# Patient Record
Sex: Female | Born: 1960 | Race: Black or African American | Hispanic: No | Smoking: Never smoker
Health system: Southern US, Community
[De-identification: ages and names within clinical notes are randomized; demographics above are authoritative.]

## PROBLEM LIST (undated history)

## (undated) DIAGNOSIS — R413 Other amnesia: Secondary | ICD-10-CM

## (undated) DIAGNOSIS — E785 Hyperlipidemia, unspecified: Secondary | ICD-10-CM

## (undated) DIAGNOSIS — I639 Cerebral infarction, unspecified: Secondary | ICD-10-CM

## (undated) DIAGNOSIS — Z86718 Personal history of other venous thrombosis and embolism: Secondary | ICD-10-CM

## (undated) DIAGNOSIS — I10 Essential (primary) hypertension: Secondary | ICD-10-CM

## (undated) DIAGNOSIS — I519 Heart disease, unspecified: Secondary | ICD-10-CM

## (undated) HISTORY — PX: OTHER SURGICAL HISTORY: SHX169

## (undated) HISTORY — DX: Heart disease, unspecified: I51.9

## (undated) HISTORY — DX: Cerebral infarction, unspecified: I63.9

## (undated) HISTORY — DX: Other amnesia: R41.3

## (undated) HISTORY — PX: TOE SURGERY: SHX1073

## (undated) HISTORY — PX: FOOT SURGERY: SHX648

## (undated) HISTORY — DX: Hyperlipidemia, unspecified: E78.5

## (undated) HISTORY — DX: Essential (primary) hypertension: I10

## (undated) HISTORY — DX: Personal history of other venous thrombosis and embolism: Z86.718

---

## 1996-01-04 HISTORY — PX: ABDOMINAL HYSTERECTOMY: SHX81

## 1999-02-11 ENCOUNTER — Inpatient Hospital Stay (HOSPITAL_COMMUNITY): Admission: EM | Admit: 1999-02-11 | Discharge: 1999-02-14 | Payer: Self-pay | Admitting: Cardiology

## 1999-02-11 ENCOUNTER — Encounter: Payer: Self-pay | Admitting: Emergency Medicine

## 1999-02-12 ENCOUNTER — Encounter: Payer: Self-pay | Admitting: Cardiology

## 1999-02-13 ENCOUNTER — Encounter: Payer: Self-pay | Admitting: Cardiology

## 1999-02-24 ENCOUNTER — Encounter: Payer: Self-pay | Admitting: Cardiology

## 1999-02-24 ENCOUNTER — Ambulatory Visit (HOSPITAL_COMMUNITY): Admission: RE | Admit: 1999-02-24 | Discharge: 1999-02-24 | Payer: Self-pay | Admitting: Cardiology

## 1999-04-19 ENCOUNTER — Encounter: Payer: Self-pay | Admitting: Emergency Medicine

## 1999-04-19 ENCOUNTER — Inpatient Hospital Stay (HOSPITAL_COMMUNITY): Admission: EM | Admit: 1999-04-19 | Discharge: 1999-04-20 | Payer: Self-pay | Admitting: Emergency Medicine

## 2001-12-17 ENCOUNTER — Encounter: Payer: Self-pay | Admitting: Emergency Medicine

## 2001-12-17 ENCOUNTER — Emergency Department (HOSPITAL_COMMUNITY): Admission: EM | Admit: 2001-12-17 | Discharge: 2001-12-17 | Payer: Self-pay | Admitting: Emergency Medicine

## 2002-07-02 ENCOUNTER — Encounter: Payer: Self-pay | Admitting: Family Medicine

## 2002-07-02 ENCOUNTER — Encounter: Admission: RE | Admit: 2002-07-02 | Discharge: 2002-07-02 | Payer: Self-pay | Admitting: Family Medicine

## 2002-09-27 ENCOUNTER — Emergency Department (HOSPITAL_COMMUNITY): Admission: EM | Admit: 2002-09-27 | Discharge: 2002-09-28 | Payer: Self-pay | Admitting: Emergency Medicine

## 2002-09-27 ENCOUNTER — Encounter (HOSPITAL_BASED_OUTPATIENT_CLINIC_OR_DEPARTMENT_OTHER): Payer: Self-pay | Admitting: General Surgery

## 2002-10-02 ENCOUNTER — Other Ambulatory Visit: Admission: RE | Admit: 2002-10-02 | Discharge: 2002-10-02 | Payer: Self-pay | Admitting: *Deleted

## 2002-10-09 ENCOUNTER — Encounter: Payer: Self-pay | Admitting: *Deleted

## 2002-10-11 ENCOUNTER — Ambulatory Visit (HOSPITAL_COMMUNITY): Admission: RE | Admit: 2002-10-11 | Discharge: 2002-10-11 | Payer: Self-pay | Admitting: *Deleted

## 2002-10-23 ENCOUNTER — Ambulatory Visit (HOSPITAL_COMMUNITY): Admission: RE | Admit: 2002-10-23 | Discharge: 2002-10-24 | Payer: Self-pay | Admitting: Cardiology

## 2003-05-08 ENCOUNTER — Encounter (INDEPENDENT_AMBULATORY_CARE_PROVIDER_SITE_OTHER): Payer: Self-pay | Admitting: *Deleted

## 2003-05-08 ENCOUNTER — Inpatient Hospital Stay (HOSPITAL_COMMUNITY): Admission: RE | Admit: 2003-05-08 | Discharge: 2003-05-10 | Payer: Self-pay | Admitting: *Deleted

## 2004-01-08 ENCOUNTER — Encounter: Payer: Self-pay | Admitting: Emergency Medicine

## 2004-01-09 ENCOUNTER — Inpatient Hospital Stay (HOSPITAL_COMMUNITY): Admission: EM | Admit: 2004-01-09 | Discharge: 2004-01-19 | Payer: Self-pay | Admitting: Neurology

## 2004-01-09 ENCOUNTER — Encounter: Payer: Self-pay | Admitting: Internal Medicine

## 2004-01-09 ENCOUNTER — Ambulatory Visit: Payer: Self-pay | Admitting: Internal Medicine

## 2004-01-19 ENCOUNTER — Inpatient Hospital Stay (HOSPITAL_COMMUNITY)
Admission: RE | Admit: 2004-01-19 | Discharge: 2004-01-29 | Payer: Self-pay | Admitting: Physical Medicine & Rehabilitation

## 2004-01-19 ENCOUNTER — Ambulatory Visit: Payer: Self-pay | Admitting: Physical Medicine & Rehabilitation

## 2004-02-04 ENCOUNTER — Encounter
Admission: RE | Admit: 2004-02-04 | Discharge: 2004-05-04 | Payer: Self-pay | Admitting: Physical Medicine & Rehabilitation

## 2004-02-09 ENCOUNTER — Inpatient Hospital Stay (HOSPITAL_COMMUNITY): Admission: EM | Admit: 2004-02-09 | Discharge: 2004-02-10 | Payer: Self-pay | Admitting: Emergency Medicine

## 2004-03-03 ENCOUNTER — Encounter
Admission: RE | Admit: 2004-03-03 | Discharge: 2004-06-01 | Payer: Self-pay | Admitting: Physical Medicine & Rehabilitation

## 2004-03-05 ENCOUNTER — Ambulatory Visit: Payer: Self-pay | Admitting: Physical Medicine & Rehabilitation

## 2004-05-03 ENCOUNTER — Ambulatory Visit: Payer: Self-pay | Admitting: Physical Medicine & Rehabilitation

## 2004-12-17 ENCOUNTER — Emergency Department (HOSPITAL_COMMUNITY): Admission: EM | Admit: 2004-12-17 | Discharge: 2004-12-17 | Payer: Self-pay | Admitting: Emergency Medicine

## 2005-06-07 ENCOUNTER — Ambulatory Visit: Payer: Self-pay | Admitting: Nurse Practitioner

## 2005-06-07 ENCOUNTER — Emergency Department (HOSPITAL_COMMUNITY): Admission: EM | Admit: 2005-06-07 | Discharge: 2005-06-07 | Payer: Self-pay | Admitting: Emergency Medicine

## 2005-06-07 ENCOUNTER — Ambulatory Visit: Payer: Self-pay | Admitting: *Deleted

## 2005-06-21 ENCOUNTER — Ambulatory Visit: Payer: Self-pay | Admitting: Nurse Practitioner

## 2006-03-09 ENCOUNTER — Ambulatory Visit: Payer: Self-pay | Admitting: Nurse Practitioner

## 2006-03-21 ENCOUNTER — Encounter: Admission: RE | Admit: 2006-03-21 | Discharge: 2006-03-21 | Payer: Self-pay | Admitting: Nurse Practitioner

## 2006-04-11 ENCOUNTER — Encounter: Admission: RE | Admit: 2006-04-11 | Discharge: 2006-07-10 | Payer: Self-pay | Admitting: Nurse Practitioner

## 2006-05-13 ENCOUNTER — Ambulatory Visit: Payer: Self-pay | Admitting: Cardiology

## 2006-05-13 ENCOUNTER — Inpatient Hospital Stay (HOSPITAL_COMMUNITY): Admission: EM | Admit: 2006-05-13 | Discharge: 2006-05-22 | Payer: Self-pay | Admitting: Emergency Medicine

## 2006-05-13 ENCOUNTER — Ambulatory Visit: Payer: Self-pay | Admitting: Cardiovascular Disease

## 2006-05-15 ENCOUNTER — Encounter (INDEPENDENT_AMBULATORY_CARE_PROVIDER_SITE_OTHER): Payer: Self-pay | Admitting: Neurology

## 2006-05-15 ENCOUNTER — Encounter (INDEPENDENT_AMBULATORY_CARE_PROVIDER_SITE_OTHER): Payer: Self-pay | Admitting: Interventional Cardiology

## 2006-05-17 ENCOUNTER — Ambulatory Visit: Payer: Self-pay | Admitting: Physical Medicine & Rehabilitation

## 2006-05-25 ENCOUNTER — Ambulatory Visit: Payer: Self-pay | Admitting: Cardiology

## 2006-08-30 ENCOUNTER — Ambulatory Visit: Payer: Self-pay | Admitting: Internal Medicine

## 2006-08-30 ENCOUNTER — Encounter (INDEPENDENT_AMBULATORY_CARE_PROVIDER_SITE_OTHER): Payer: Self-pay | Admitting: Nurse Practitioner

## 2006-08-30 LAB — CONVERTED CEMR LAB
ALT: 13 units/L (ref 0–35)
AST: 13 units/L (ref 0–37)
Albumin: 3.9 g/dL (ref 3.5–5.2)
Basophils Absolute: 0 10*3/uL (ref 0.0–0.1)
Basophils Relative: 1 % (ref 0–1)
CO2: 21 meq/L (ref 19–32)
Calcium: 9.2 mg/dL (ref 8.4–10.5)
Chloride: 107 meq/L (ref 96–112)
Cholesterol: 299 mg/dL — ABNORMAL HIGH (ref 0–200)
Creatinine, Ser: 0.89 mg/dL (ref 0.40–1.20)
Hemoglobin: 14.1 g/dL (ref 12.0–15.0)
Lymphocytes Relative: 36 % (ref 12–46)
MCHC: 32.4 g/dL (ref 30.0–36.0)
Neutro Abs: 3.4 10*3/uL (ref 1.7–7.7)
Neutrophils Relative %: 54 % (ref 43–77)
Platelets: 342 10*3/uL (ref 150–400)
Potassium: 3.8 meq/L (ref 3.5–5.3)
RDW: 14.6 % — ABNORMAL HIGH (ref 11.5–14.0)
Sodium: 140 meq/L (ref 135–145)
Total CHOL/HDL Ratio: 9.1
Total Protein: 7.1 g/dL (ref 6.0–8.3)

## 2006-09-20 ENCOUNTER — Encounter (INDEPENDENT_AMBULATORY_CARE_PROVIDER_SITE_OTHER): Payer: Self-pay | Admitting: *Deleted

## 2008-04-18 ENCOUNTER — Emergency Department (HOSPITAL_COMMUNITY): Admission: EM | Admit: 2008-04-18 | Discharge: 2008-04-18 | Payer: Self-pay | Admitting: Emergency Medicine

## 2008-07-29 ENCOUNTER — Emergency Department (HOSPITAL_COMMUNITY): Admission: EM | Admit: 2008-07-29 | Discharge: 2008-07-29 | Payer: Self-pay | Admitting: Emergency Medicine

## 2009-06-10 ENCOUNTER — Emergency Department (HOSPITAL_COMMUNITY): Admission: EM | Admit: 2009-06-10 | Discharge: 2009-06-10 | Payer: Self-pay | Admitting: Emergency Medicine

## 2010-03-22 LAB — POCT I-STAT, CHEM 8
Creatinine, Ser: 1 mg/dL (ref 0.4–1.2)
Glucose, Bld: 105 mg/dL — ABNORMAL HIGH (ref 70–99)
Hemoglobin: 15.6 g/dL — ABNORMAL HIGH (ref 12.0–15.0)
Potassium: 3.7 mEq/L (ref 3.5–5.1)

## 2010-03-22 LAB — POCT CARDIAC MARKERS
CKMB, poc: 1 ng/mL (ref 1.0–8.0)
Troponin i, poc: <0.05 ng/mL (ref 0.00–0.09)

## 2010-03-22 LAB — CBC
HCT: 44.2 % (ref 36.0–46.0)
Hemoglobin: 14.9 g/dL (ref 12.0–15.0)
MCHC: 33.7 g/dL (ref 30.0–36.0)
RBC: 5.06 MIL/uL (ref 3.87–5.11)

## 2010-03-22 LAB — DIFFERENTIAL
Basophils Relative: 1 % (ref 0–1)
Eosinophils Relative: 1 % (ref 0–5)
Lymphocytes Relative: 25 % (ref 12–46)
Monocytes Absolute: 0.5 10*3/uL (ref 0.1–1.0)
Monocytes Relative: 7 % (ref 3–12)
Neutro Abs: 5 10*3/uL (ref 1.7–7.7)

## 2010-03-22 LAB — BASIC METABOLIC PANEL
CO2: 29 mEq/L (ref 19–32)
Calcium: 9.3 mg/dL (ref 8.4–10.5)
GFR calc Af Amer: >60 mL/min (ref >60)
Potassium: 3.6 mEq/L (ref 3.5–5.1)
Sodium: 140 mEq/L (ref 135–145)

## 2010-04-14 LAB — CBC
Hemoglobin: 15.4 g/dL — ABNORMAL HIGH (ref 12.0–15.0)
MCHC: 33.3 g/dL (ref 30.0–36.0)
RBC: 5.31 MIL/uL — ABNORMAL HIGH (ref 3.87–5.11)
WBC: 6.7 10*3/uL (ref 4.0–10.5)

## 2010-04-14 LAB — COMPREHENSIVE METABOLIC PANEL
BUN: 7 mg/dL (ref 6–23)
CO2: 26 mEq/L (ref 19–32)
Calcium: 9.2 mg/dL (ref 8.4–10.5)
Chloride: 107 mEq/L (ref 96–112)
Creatinine, Ser: 1.06 mg/dL (ref 0.4–1.2)
Glucose, Bld: 112 mg/dL — ABNORMAL HIGH (ref 70–99)
Total Bilirubin: 0.9 mg/dL (ref 0.3–1.2)

## 2010-04-14 LAB — PROTIME-INR
INR: 1 (ref 0.00–1.49)
Prothrombin Time: 13 seconds (ref 11.6–15.2)

## 2010-04-14 LAB — APTT: aPTT: 25 seconds (ref 24–37)

## 2010-04-14 LAB — DIFFERENTIAL
Basophils Absolute: 0 10*3/uL (ref 0.0–0.1)
Eosinophils Relative: 1 % (ref 0–5)
Lymphocytes Relative: 24 % (ref 12–46)
Neutro Abs: 4.5 10*3/uL (ref 1.7–7.7)
Neutrophils Relative %: 67 % (ref 43–77)

## 2010-04-14 LAB — TROPONIN I: Troponin I: <0.01 ng/mL (ref 0.00–0.06)

## 2010-04-14 LAB — CK TOTAL AND CKMB (NOT AT ARMC): Relative Index: INVALID (ref 0.0–2.5)

## 2010-05-18 NOTE — Discharge Summary (Signed)
[name], [name]              ACCOUNT NO.:  [id number]   MEDICAL RECORD NO.:  [id number]          PATIENT TYPE:  INP   LOCATION:  3005                         FACILITY:  [facility]   PHYSICIAN:  Pramod P. Pearlean Brownie, MD    DATE OF BIRTH:  1960-02-22   DATE OF ADMISSION:  05/13/2006  DATE OF DISCHARGE:  05/22/2006                               DISCHARGE SUMMARY   DISCHARGE DIAGNOSES:  1. 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  2. Hypertension.  3. Acute ischemic cardiomyopathy.  4. Coronary artery disease.  5. Hypertension.  6. Hypercholesterolemia.  7. Anemia.  8. Obesity.   DISCHARGE MEDICATIONS:  1. Aspirin 81 mg a day.  2. Coumadin 5 mg a day.  3. Zocor 60 mg a day.  4. Diovan 80 mg a day.  5. Toprol-XL 100 mg a day.   STUDIES PERFORMED:  1. CT of the brain on admission shows no acute abnormality.  Evolution      of chronic ischemic changes in the right frontal lobe with      occasional punctate hyperdensities favored to reflect dystrophic      calcification.  Stable chronic encephalomalacia in the bilateral      parietal and left cerebellar regions.  2. MRI of the brain shows no acute abnormality.  Multifocal      encephalomalacia related to chronic infarct.  3. Cardiac MRI shows ischemic cardiomyopathy, marked to large anterior      apical/inferior apical wall infarction with two-thirds to full-      thickness scar.  Large apical thrombus measuring 1.8 x 1.7 cm in      the area of __________  kinesis.  4. Transthoracic echocardiogram shows EF of 35-40% with severe      hypokinesis of the apical wall.  There was moderate hypokinesis in      the mid-anterior wall.  Left ventricular wall thickness was mildly      increased.  There was a mass along the wall of the left ventricle.      There was a large 21-mm by 23-mm rounded mass along the apex of the      left ventricle.  There appeared to be a stalk connecting it to the      left  ventricle.  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  Aortic root was upper      limits of normal, left atrial size was upper limits of normal.  5. EKG shows normal sinus rhythm with left axis deviation, septal      infarct, age undetermined, T-wave abnormality, consider      anterolateral ischemia, abnormal ECG.  6. 2-D echocardiogram shows no evidence of ICA stenosis.  7. EEG shows left posterior temporal slowing.  No seizure activity.  8. Transcranial Doppler performed, results pending.   LABORATORY STUDIES:  CBC with RDW 14.4, otherwise normal differential,  with absolute lymphocytes 3.7 and absolute monocytes 0.9; otherwise,  normal coagulation studies normal on admission.  Day  of discharge, INR  2.4.  Routine chemistries with a  BUN of 5, creatinine 0.94; otherwise,  normal.  Liver function tests with albumin 3.3; otherwise, normal.  Hemoglobin A1C 5.3.  Homocystine 4.3.  Cardiac enzymes with CK 264;  otherwise, normal.  Cholesterol 252, triglycerides 145, HDL 26 and LDL  197.  Alcohol level was less than 5.  The urine drug screen was  negative.  Urinalysis showed small bilirubin, 30 protein, and few  epithelials, with 3-6 red blood cells.   HISTORY OF PRESENT ILLNESS:  Ms. Capwell is a 50 year old right-handed  African American female who developed sudden onset of confusion and  difficulty speaking.  EMS was called and brought her to the emergency  room.  In the emergency room, the patient was is without any family are  friends and does appear confused and anxious.  The patient had been  admitted in May of 2006 with a right MCA infarct which is still seen on  CT.  It was a rather large stroke involving the posterior frontal  temporal and part of the parietal lobe.  This infarct was secondary to a  cardiac mass for which she was placed on Coumadin.  Her INR in the  emergency room is normal.  Therefore, it is felt the  patient was not on  Coumadin prior to admission.  The patient has vascular risk factors of  hypertension, coronary artery disease, and ischemic cardiomyopathy.  Her  blood pressure in the emergency room was 220/115.  She was not a TPA  candidate secondary to unknown onset.  She will be admitted to the  hospital for further stroke evaluation.  She was not a TPA candidate  secondary to time.   HOSPITAL COURSE:  A 2-D echocardiogram confirmed a cardiac mass which  was felt to either be a thrombus as seen in 2006 or a tumor.  [facility] was called to consult.  MRI of the heart was ordered to  determine clot versus tumor, and MRI proved it was clot.  The patient  was placed on IV heparin for secondary stroke prevention and started on  Coumadin.  Once she was therapeutic, she was ready for discharge home.  Of note, the patient was found to have elevated lipids and was placed on  Zocor.  She also had ischemic cardiomyopathy which the cardiologists are  treating with medicines currently.  Neurologically, the patient with  significant expressive and receptive aphasia throughout the hospital  stay.  She was evaluated by PT and OT and felt to be too high-level for  rehab.  The patient lives with her daughter who is pregnant and  expecting any day.  Plans are for her to return home with her daughter,  and we will add home health, PT, OT, and speech therapy.  We have  recommended that the patient have 24-hour care at time of discharge.  The family are unsure if they can provide.  Again, the patient is on  aspirin and Coumadin for secondary stroke prevention and will need  followup with the [facility].   When the patient was seen in 2006, her primary care physician was Dr.  Modesto Charon.  Since that time she has changed primary care physicians to  [facility].  Since she has been a patient at [facility], she has not  been taking Coumadin and is unsure when she stopped taking Coumadin after  the first stroke, how long it lasted.  Arrangements will be made  for [facility] or  [facility] to follow up for Coumadin level.  Continue on  hold through this dictation trying to make her a followup appointment  with [facility].  The patient does plan to live with Artelia Laroche, her  daughter.  Her mother is involved in her care.   CONDITION ON DISCHARGE:  The patient alert and oriented to person and  place.  Her speech is clear.  She has mild expressive aphasia.  She can  name but she does perseverate.  She can repeat but she has decreased  spontaneous speech and word finding difficulty.  She can follow simple  commands.  She can show two fingers.  She has difficulty with complex  commands.  She has left hematemesis that is old and no drift.  She has  decreased grip on the left which is new.  Her gait is slightly unsteady  and wide-based.  Her chest is clear to auscultation.  Heart rate is  regular.   DISCHARGE/PLAN:  1. Discharge home with family.  24-hour care recommended.  2. Coumadin for secondary stroke prevention.  3. Will need followup Coumadin level check on Wednesday.  Still trying      to make appointment with [facility] at this time.  If unable to      make appointment with [facility], will arrange with [facility].  4. Follow up with Dr. Jens Som at [facility] in 4 weeks.  5. Follow up with [facility] in 4 weeks.  6. Follow up with Dr. Marlowe Shores in 2 to 3 months.      Annie Main, N.P.    ______________________________  Sunny Schlein. Pearlean Brownie, MD    SB/MEDQ  D:  05/22/2006  T:  05/22/2006  Job:  [id number]   cc:   Pramod P. Pearlean Brownie, MD  Madolyn Frieze. Jens Som, MD, [facility]  [facility]  Tracie Harrier, M.D.

## 2010-05-18 NOTE — Procedures (Signed)
EEG NUMBER:  Is [id number].   This is a 50 year old patient with a history of confusion and left-sided  weakness.  The patient is being evaluated for the confusion.  This is a  routine EEG.   No skull defects were noted.   MEDICATIONS:  Zocor, Protonix, Normodyne, Reglan, Tylenol.   EEG CLASSIFICATION:  Dysrhythmia grade 2 left posterior temporal.   DESCRIPTION OF THE RECORDING:  Background rhythm of this recording  consists of a fairly well modulated, medium amplitude alpha rhythm of 8  Hz that is reactive to eye-opening and closure.  As the record  progresses, the most notable feature of the recording is some  dysrhythmic theta activity emanating from the left posterior temporal  area.  This is occasionally associated with some 2 Hz delta activity as  well.  As the record progresses, this posterior temporal slowing on the  left seems to be fairly persistent.  Photic stimulation is performed  resulting in a bilateral photic drive response.  Hyperventilation is  also performed resulting in minimal buildup of the background rhythm  activities will more prominent slowing seen in the left posterior  temporal areas.  At no time during recording, does there appear to be  evidence of spike, spike wave discharges, or evidence of focal slowing,  other than what is mentioned above.   EKG monitor shows no evidence of cardiac rhythm abnormalities with a  heart rate of 78.   IMPRESSION:  This is an abnormal EEG recording due to slowing emanating  from the left posterior temporal area.  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  No electrographic seizures were  seen during the recording.      Marlan Palau, M.D.  Electronically Signed     NWG:NFAO  D:  05/15/2006 13:39:20  T:  05/15/2006 14:23:48  Job #:  [id number]

## 2010-05-18 NOTE — Consult Note (Signed)
[name], [name] NO.:  [id number]   MEDICAL RECORD NO.:  [id number]          PATIENT TYPE:  EMS   LOCATION:  MAJO                         FACILITY:  [facility]   PHYSICIAN:  Melvyn Novas, M.D.  DATE OF BIRTH:  1960/06/01   DATE OF CONSULTATION:  05/13/2006  DATE OF DISCHARGE:                                 CONSULTATION   ADDENDUM:  Ms. Oyama's laboratory results have returned.  Sodium is  139, potassium is 3.6, chloride 106, CO2 of 26, BUN is 10, glucose is  119, hematocrit of 48, hemoglobin was 16.3.  The patient's PT and INR at  this time are still pending, and we are awaiting a tox screen.   The patient denies using tobacco, alcohol or illegal drug products.      Melvyn Novas, M.D.  Electronically Signed     CD/MEDQ  D:  05/13/2006  T:  05/13/2006  Job:  [id number]   cc:   Thelma Barge P. Modesto Charon, M.D.  Pramod P. Pearlean Brownie, MD

## 2010-05-18 NOTE — Consult Note (Signed)
[name], [name]              ACCOUNT NO.:  [id number]   MEDICAL RECORD NO.:  [id number]          PATIENT TYPE:  INP   LOCATION:  3005                         FACILITY:  [facility]   PHYSICIAN:  Madolyn Frieze. Jens Som, MD, FACCDATE OF BIRTH:  11/25/1960   DATE OF CONSULTATION:  05/16/2006  DATE OF DISCHARGE:                                 CONSULTATION   PRIMARY CARDIOLOGIST:  Learta Codding, MD,FACC.  (in [place], [place]).   PRIMARY CARE PHYSICIAN:  [facility]   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   HISTORY OF PRESENT ILLNESS:  [name] is a 50 year old female with  a history of coronary artery disease.  She was last evaluated by  cardiology in 2006 when she was admitted to the hospital for a CVA.  At  that time she had an echocardiogram demonstrating and apical mass and  she was anticoagulated with Coumadin.  Since then cardiology has not  seen her.  In subsequent notes and dictations she was noted to have been  on Coumadin later in 2006, but a follow-up visit and ER visit from 2007,  do not list Coumadin as one of her medications and she was not taking it  when she came to the hospital this time.   [name] was admitted on May 13, 2006, for possible CVA and she was  also felt to have hypertensive encephalopathy.  Her initial imaging  studies which included a CT and MRI were negative.  However, repeat CT  on May 15, 2006, did show a subacute infarct.  A repeat echocardiogram  again showed an apical mass with an apical wall motion abnormality.  Cardiology was asked to evaluate her.   [name] is able to answer yes or no to questions but cannot provide  any details.  She denies chest pain or shortness of breath.  We are not  able to determine when she was taken off Coumadin and for what reason.   PAST MEDICAL HISTORY:  1. Status post anterior MI in 2001 with a Royal Elite stent in the      LAD.  2. Status post cardiac catheterization in  2004, showing 70-80% in-      stent restenosis in the proximal LAD which was treated with PTCA      and Cypher stent reducing the stenosis to 0.  Circumflex and RCA      systems had no significant disease.  An EF at that time was 45%.  3. Status post participation in the STEEPLE trial in 2004 which      involved IV Lovenox.  4. Morbid obesity.  5. Hypertension.  6. Hyperlipidemia.  7. History of urine fibroids causing blood loss anemia.  8. Right MCA CVA in January 2006.  9. Splenic infarcts seen on abdominal CT in 2006 at the time of her      CVA.  10.Ischemic cardiomyopathy with an EF of 35-40% by echocardiogram this      admission.  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  12.History of thrombocytopenia with a platelet count of 112,000 in      2006 while she was on heparin, HIP panel negative.   PAST SURGICAL HISTORY:  1. She is status post cardiac catheterization x2.  2. Total abdominal hysterectomy.  3. Foot surgery.  4. Right rotator cuff surgery.   ALLERGIES:  NO KNOWN DRUG ALLERGIES.   CURRENT MEDICATIONS:  1. Aspirin 325 mg daily.  2. Lovenox 40 mg subcu daily.  3. Toprol XL 25 mg daily.  4. Protonix 40 mg daily.  5. Zocor 60 mg daily.   SOCIAL HISTORY:  She lives in [place], [place], with family  members.  She is not employed.  She is disabled secondary to her  previous CVA, but not known to be on disability.  She has no history of  alcohol, tobacco or drug abuse.   FAMILY HISTORY:  Both her parents are deceased and had a history of  hypertension, but no other family history is available.   REVIEW OF SYSTEMS:  [name] is dysphasic but can answer yes or no  questions.  She denies all symptoms but there is concern that she does  not accurately understand the questioning.  She does, however, deny  chest pain, shortness of breath, bloody stools or black tarry stools,  abdominal  pain or reflux symptoms, arthralgias and depression.  A full  review of systems is otherwise negative per patient report.   PHYSICAL EXAMINATION:  VITAL SIGNS:  Temperature is 98.7, blood pressure  133/85, pulse 81, respiratory 20, O2 saturation 98% on room air.  GENERAL:  She is an alert and oriented, obese [place] female  who is pleasant but has expressive aphasia that seems to frustrate her.  HEENT:  Normal.  NECK:  There is no lymphadenopathy, thyromegaly, bruit or JVD noted.  CARDIOVASCULAR:  Her heart is regular in rate and rhythm with no  significant murmur, rub or gallop noted.  Distal pulses are intact in  all for extremities with no femoral bruits appreciated.  LUNGS:  Clear to auscultation bilaterally.  SKIN:  No rashes or lesions are noted.  ABDOMEN:  Soft and nontender with active bowel sounds and no rebound or  guarding is noted.  EXTREMITIES:  There is no cyanosis, clubbing or edema noted.  MUSCULOSKELETAL:  There is no joint deformity or effusions and no spine  or CVA tenderness.  NEUROLOGIC:  She is alert and responds to her name.  She can answer yes  or no to questions.  She has no obvious asymmetry.   IMPRESSION:  No obvious facial asymmetry noted and minimal weakness is  detected on the left side.   EKG is sinus rhythm, rate 86, with a septal infarct noted.   A 2-D echocardiogram, EF is 35-40% with a large 21 x 23 mm rounded mass  along the apex of the left ventricle.  There was severe hypokinesis of  the apical wall and moderate hypokinesis of the mid anterior wall.  There appeared to be a stalk connecting the mass to the left ventricle.  It is similar in size to the echocardiogram from 2006.   Head CT dated May 15, 2006, chronic right MCA infarct is noted.  She has  developed an ill-defined low-density in the left parietal lobe  compatible with a subacute cortical infarct since CT dated May 13, 2006. Mild local mass effect is seen but no midline  shift.  There is no acute  hemorrhage.  LABORATORY VALUES:  Hemoglobin 16.3, hematocrit 48, wbc 10.1, platelets  312.  INR 1 on admission.  Sodium 138, potassium 3.4, chloride 106, CO2  23, BUN 5, creatinine 0.79, glucose 97.  CK-MB 264/1.4 with a troponin I  of 0.03.  Hemoglobin A1c 5.3. Total cholesterol 252, triglycerides 145,  HDL 26, LDL 197.  Homocysteine level 4.3.  Urine drug screen was  negative and EtOH less than 5.   IMPRESSION:  Left ventricular mass:  [name] has a history of a  probable apical thrombus as well as hypertension, hyperlipidemia and a  cerebrovascular accident.  We are seeing her for evaluation of an  intraventricular mass.  She had a cerebrovascular accident in January  2006 and at that time an echo showed apical akinesis, an ejection  fraction of 30-35% and a large apical mass felt most likely to be apical  thrombus.  She also had a splenic infarct at that time.  She was treated  with Coumadin but the duration is unclear.  It is also unclear what  medication she was taking prior to admission.  She was admitted on May 13, 2006 with hypertension and weakness and an echo now shows a  continued apical mass, thrombus versus tumor.  The etiology of the mass  is unclear.  However, given previous apical infarct and akinesis, it is  felt that this is most likely thrombosis especially since it is not  clear that patient was taking Coumadin for an extended period of time  and her INR on admission was within normal limits.  A cardiac MRI may  differentiate between tumor and thrombus.  She will be evaluated for  surgical resection versus Coumadin if thrombus is the etiology.  We will  add heparin (okay with neurology).  She is being treated appropriately  with aspirin and beta blockers and Statin.  We will add an ACE inhibitor  secondary to left ventricular dysfunction and hypertension and her beta  blocker and ACE inhibitor can be up-titrated as tolerated.  Her  renal  function will be followed closely.  Of note, her potassium will be  supplemented.  Because of anticoagulation with heparin her platelets  will be followed closely although a HIP panel was negative last time and  her platelets never dropped below 112,000.  In 2006 when she was  therapeutic on Coumadin, a hypercoagulable profile showed low  circulating as well as functional protein C and protein S levels with a  negative lupus anticoagulant and negative Factor 5 Leiden gene mutation.  The significance of these results is unclear, but further evaluation may  be indicated.      Theodore Demark, PA-C      Madolyn Frieze. Jens Som, MD, [facility]  Electronically Signed    RB/MEDQ  D:  05/16/2006  T:  05/16/2006  Job:  [id number]

## 2010-05-18 NOTE — H&P (Signed)
[name], [name]              ACCOUNT NO.:  [id number]   MEDICAL RECORD NO.:  [id number]          PATIENT TYPE:  INP   LOCATION:  3005                         FACILITY:  [facility]   PHYSICIAN:  Melvyn Novas, M.D.  DATE OF BIRTH:  1960-06-17   DATE OF ADMISSION:  05/13/2006  DATE OF DISCHARGE:                              HISTORY & PHYSICAL   NEUROLOGY CODE STROKE ADMISSION:   HISTORY OF PRESENT ILLNESS:  [name] is a 50 year old African-  American right-handed female, and according to the code stroke  information had developed confusion and difficulty speaking when EMS  from [facility] brought her to get to the emergency room.  Here in  the emergency room, the patient is without any family or friend support  and appears indeed confused and anxious.  The patient had been admitted  in May of 2006 with a right MCA infarction which is still visible on her  ER CT scan from today.  This is a rather large stroke involving the  posterior frontal, temporal and part of the parietal lobe.  The patient, according to the medical charts available by E-chart, was  left with a spastic left hemiparesis and had undergone rehabilitation  here at the [facility] after being discharged from Dr. Marlis Edelson  stroke service.   She also carries a diagnosis of hypertension, coronary artery disease,  left biceps tenonitis which followed her hemispasticity.  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  The patient was discharged on therapeutic Coumadin in 2006, and her  review of systems at the time only induced some vertigo, hemispasticity,  dyslipidemia and a history of splenic infarction, unknown date.   The patient at the time of evaluation here in the ER through the stroke  team shows left-sided grip strength weakness.  She  appears oriented to  name and place, but repeatedly perseverates and asks Am I going to  die?  Asked if she had recently followed up with medical care, she  states that her doctor, Dr. Modesto Charon, had left the practice she used to be  seen in and did not answer my question when she last refilled her  antihypertensive.  On a CT scan that was obtained today, there is no new stroke seen.  Her  hypertension reached 220/115 at the time when she was brought into the  emergency room, and the patient was supposed to take Coumadin for her  apical thrombus, as we understand from her E-chart.   SOCIAL HISTORY:  According to the E-chart, the patient used to live with  family here in the [place] area and had returned to live with her  family after her initial stroke.   REVIEW OF SYSTEMS:  The patient reports today, I don't know what's  going on me.  I am confused.  Tell me I'm not going to die, tell me I'm  not going to die.  She does not report any pain, blurred vision,  anxiety, depression, weakness that she has noted to be increased,  shortness of breath.  There is no diaphoresis, no coughing.  The patient  only say that she has to go to the bathroom.  She does not comprehend  that she has a Foley catheter.   PHYSICAL EXAMINATION:  VITAL SIGNS:  Blood pressure is 180/110 now after  the first dose of labetalol was given, pulse is 88, respiratory rate 18.  The patient is saturating 98% on room air.  Nasal cannula at 2 liters  was ordered.  GENERAL:  The patient could sit up with minimal assistance, shows a left-  sided pronator drift, shows a left-sided facial droop.  Her affect is  impaired by her anxiety.  She is apparently well-groomed, well-  nourished.  LUNGS:  clear to auscultation.  She is no peripheral clubbing or edema.  ABDOMEN:  Obese but not distended.  There are normal bowel sounds.  There is no flank pain.   NEUROLOGIC:  The patient was able to name her birth date and knows where   she is, could give [place] her name and her address.  She does not know if  family members have followed EMS.  Cranial nerve examination - pupils  react equal to light.  The patient has a left lower facial weakness that  is more evidence when she provides a grin.  She can also close her left  eye, not quite as tight as the right.  She was able to follow a moving  object in all planes with gaze and peripheral visual field examination  was remarkably normal.  The patient had a uvula and tongue in midline.  Bedside swallow evaluation with ice water and a cracker was passed.  Motor examination - the patient is able to extend the left arm, not  fully.  She also has wrist flexion/extension weakness.  She does have a  slight increase in reflexes throughout the left side.  She was able to  provide antigravity strength for 10 seconds with both upper extremities.  There is no tremor, no ataxia.  She had dysmetria on finger-nose test,  but repeated testing did not show the dysmetria to be persistent.  She  could provide a heel-to-shin test with her lower extremities, could  raise either leg for over 5 seconds, left side shows a drift.  Dorsiflexion and plantar flexion were intact.  Left Babinski response  was equivocal, right side clearly downgoing toes.  Sensory to pinprick  and temperature and vibration - the patient states that she feels that  this has not changed.  She has always had a slight left-sided decrease  in sensation.  Her cognition is not quite appropriate.  The patient again appears very anxious.  She has, however, clear speech  but seems to have word-finding difficulties plus the perseveration, as  mentioned above.   ASSESSMENT:  At this time, we will treat her for possible hypertensive  encephalopathy.  I am not sure that she has suffered a new infarction or  if all the findings above are explained with the previous right middle cerebral artery infarction and the hypertension that she  has suffered  now.  I will order a diffusion weighted image MRI.  This limited study but give [place] hopefully the answer if she indeed  suffered a new stroke, and if so, her blood pressure control needs to be  achieved first.  I have ordered labetalol 20 mg.  PT/INR was pending at the time, and  we  will need to know if the patient is still on Coumadin.  The patient can  be admitted to a primary care service if an acute stroke is not found.  She will be maintained on beta blockers and hydrochlorothiazide for  hypertension during hospitalization.  I will also order Protonix and  Zocor, given her past history of gastroesophageal reflux disease and  hyperlipidemia.  I would like to add that the patient was not able to tell me which  medications she is on, how often she takes, what kind of pills, and was  not able to tell me where she receives current medical outpatient care.      Melvyn Novas, M.D.  Electronically Signed     CD/MEDQ  D:  05/13/2006  T:  05/14/2006  Job:  [id number]   cc:   Pramod P. Pearlean Brownie, MD  Maryla Morrow. Modesto Charon, M.D.

## 2010-05-21 NOTE — Cardiovascular Report (Signed)
[name], Pace                        ACCOUNT NO.:  [id number]   MEDICAL RECORD NO.:  [id number]                   PATIENT TYPE:  OIB   LOCATION:  6525                                 FACILITY:  [facility]   PHYSICIAN:  Charlies Constable, M.D.                  DATE OF BIRTH:  13-Nov-1960   DATE OF PROCEDURE:  10/23/2002  DATE OF DISCHARGE:                              CARDIAC CATHETERIZATION   CLINICAL HISTORY:  Mrs. Ruffini is 50 years old and in 2001 had an anterior  wall infarction treated with stenting of the proximal LAD by Dr. Riley Kill.  She recently has had some symptoms of shortness of breath on exertion, but  has also been anemic.  She was scheduled to have fibroid surgery and was  seen by Dr. Andee Lineman who arranged for her to have a Cardiolite scan which was  abnormal showing an anterior apical scar with superimposed ischemia.  He  scheduled her evaluation with angiography.   PROCEDURE:  The procedure was performed via the right femoral artery using  arterial sheath and 6 French preformed coronary catheters. A front wall  arterial puncture was performed and Omnipaque contrast was used.  After  completion of the diagnostic study, we made the decision to proceed with  intervention on the lesion within the stent in the proximal LAD.   The patient was consented and enrolled in the STEEPLE trial and was  randomized to Lovenox 0.5 mg/kg IV.  She also received 600 mg of Plavix.  We  used a Q4 6 [place] guiding catheter with side holes and a short Floppy wire.  We crossed the lesion in the proximal LAD within the stent with the wire  without difficulty.  We direct stented with a 3.0 x 22-mm Cypher stent  deploying this with one inflation of 15 atmospheres for 30 seconds.  We then  post dilated with a 3.25 x 20-mm Quantum Maverick performing three  inflations up to 17 atmospheres for 30 seconds.  Repeat diagnostics were  then performed through the guiding catheter.  The patient tolerated  the  procedure well and left the laboratory in satisfactory condition.   RESULTS:  Left main coronary artery:  The left main coronary was free of  significant disease.   Left anterior descending artery:  The left anterior descending artery gave  rise to four septal perforators in the diagonal branch.  There was 70-80%  diffuse in-stent restenosis within the stent in the proximal LAD.  The rest  of the vessel was free of significant obstruction.   Circumflex artery:  The circumflex artery gave rise to a marginal branch,  second marginal branch which had three subbranches and a posterior lateral  branch.  These vessels were free of significant disease.   Right coronary artery:  The right coronary artery is a small to moderate  size vessel that gave rise to a conus branch, two right  ventricular  branches, a posterior descending branch and two posterior lateral branches.   LEFT VENTRICULOGRAM:  The left ventriculogram performed in the RAO  projection showed akinesis of the apex.  The anterior lateral wall and  anterobasal wall and inferior wall all moved well.  The estimated ejection  fraction was 45%.   Following stenting of the lesion in the proximal LAD, the stenosis improved  from 70-80% to 10%.  There was no dissection seen.   The aortic pressure was 151/95 with mean of 118.  Left ventricular pressure  was 151/22.    CONCLUSIONS:  1. Coronary artery disease status anterior wall myocardial infarction in     2001 treated with stenting of the left anterior descending with 70-80% in-     stent restenosis, no major obstruction of the circumflex and right     coronary artery and apical wall akinesis.  2. Successful stenting of the in-stent restenotic lesion in the proximal     left anterior descending with improvement in stent renarrowing from 70-     80% to 10%.   DISPOSITION:  The patient to post anesthesia unit for further observation.  We plan to let the patient go home  tomorrow if she is stable.   She will need to have fibroid surgery, but will need to postpone this three  months while she is on Plavix.  I discussed the case with Dr. Andee Lineman and Dr.  Riley Kill and discussed the options before we made a decision to treat her in-  stent restenosis with the drug-eluting stent.  We felt this was best for the  heart although it did have disadvantage of requiring [place] to wait three months  for her hysterectomy.                                                   Charlies Constable, M.D.    BB/MEDQ  D:  10/23/2002  T:  10/23/2002  Job:  [id number]   cc:   Learta Codding, M.D.  [address]  [address]  [place]  [place] [address]   Maryla Morrow. Modesto Charon, M.D.  [address]  [place]  [place] [address]  Fax: [phone]   Tracie Harrier, M.D.  [address] [place]  [place] [address]  Fax: [phone]

## 2010-05-21 NOTE — Discharge Summary (Signed)
   NAMEANAHIS, FURGESON                        ACCOUNT NO.:  [id number]   MEDICAL RECORD NO.:  [id number]                   PATIENT TYPE:  OUT   LOCATION:  MLAB                                 FACILITY:  [facility]   PHYSICIAN:  Tracie Harrier, M.D.              DATE OF BIRTH:  06-05-1960   DATE OF ADMISSION:  10/11/2002  DATE OF DISCHARGE:  10/11/2002                                 DISCHARGE SUMMARY   HOSPITAL COURSE:  Ms. Blea was admitted this morning to undergo abdominal  hysterectomy for large symptomatic uterine fibroids.  She underwent a  Cardiolite test yesterday at Dr. Margarita Mail office.  We called this morning  for results of this and it did show some ischemia and was abnormal.  Her  surgery has been scheduled at a later date pending further workup of cardiac  status.   ASSESSMENT:  1. Cardiovascular disease - status undetermined.  2. Uterine fibroids - stable.   PLAN:  1. Discharge.  2. Further cardiac workup with surgery pending this workup and cardiac     clearance.                                               Tracie Harrier, M.D.    REG/MEDQ  D:  11/08/2002  T:  11/08/2002  Job:  [id number]

## 2010-05-21 NOTE — H&P (Signed)
NAMEALEXA, BLISH NO.:  [id number]   MEDICAL RECORD NO.:  [id number]          PATIENT TYPE:  EMS   LOCATION:  MAJO                         FACILITY:  [facility]   PHYSICIAN:  Melvyn Novas, M.D.  DATE OF BIRTH:  12/23/1960   DATE OF ADMISSION:  02/08/2004  DATE OF DISCHARGE:                                HISTORY & PHYSICAL   PRIMARY CARE PHYSICIAN:  Francis P. Modesto Charon, M.D.   Ms. Gravlin states that she has a past medical history of multiple strokes,  myocardial infarction with stent placement, hypertension,  hypercholesterolemia, and morbid obesity, possible OSA.  The patient has an  ejection fraction of only 30% she states.  She has been started on  medications after suffering a stroke in January of this year in her temporal  and frontal lobe and had in rehab recovered from her left hemiparesis.  Today she states that she woke up from a nap and while turning in bed to the  side had the sudden onset of severe vertigo.  The bed was spinning, she  became nauseated and clammy.  She presented here to the ER yesterday at 2:30  p.m.  A CT was obtained and shows new lesions that were read as subacute in  the occipital lobes bilaterally.  These lesions were not seen on the January  imaging studies.  Ms. Dantes INR was 2.5, her creatinine was 1.3, glucose  86.  She shows no abnormalities on her CBC and on her basic metabolic  profile.   CURRENT MEDICATIONS:  1.  Toprol XL, she does not name the dose.  2.  Lipitor and Coumadin, she does not name the dose.   PHYSICAL EXAMINATION:  VITAL SIGNS:  Heart rate 52 per minute, blood  pressure 136/80, respiratory rate 18, temperature 97.  There was a question  of a left carotid bruit.  No bruises, rash, edema.  LUNGS:  Clear to auscultation.  HEART:  Regular and sinus rhythm.  MENTAL STATUS:  Alert and oriented x3.  Fluent speech.  The patient can pass  the medical information in detail and appears to have no trouble with  comprehension or production of speech.  CRANIAL NERVES:  The patient shows facial asymmetry with a left-sided droop  and an asymmetric palpable fissure bilaterally.  Her left pupil appears  larger than her right and is also less reactive to light.  Also the patient  can provide full extraocular movements and follows gaze in all directions.  She does not show nystagmus, but feels that her left eye is acutely more  blurred.  I checked by finger perimetry, her peripheral vision and it seems  that her peripheral vision on the left is more impaired than on the right.  Gag was easily elicitable and the patient shows tongue and uvula in midline.  STRENGTH:  The patient has upgoing toe on the right which I could not find  on her previous admission notes to be present.  She has a drift when  extending the right leg against gravity on the count of 8.  Her left leg  shows clonus  of two beats in the ankle.  SENSORY:  The patient states she feels heavy, but she has normal primary  modalities throughout the right body half.  Finger-to-nose shows dysmetria  on the left and drift on the right.   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  Since the patient has a  history of apex thrombus by echocardiogram and is therapeutic on her  Coumadin,  I would like to admit her to check a repeat echo if the thrombus is still  present.  An MRI should be repeated.  We will send for hypercoagulability  panel and I have discussed these steps with Dr. Modesto Charon, whose [facility] will  follow her for her internal medicine and primary care needs.      CD/MEDQ  D:  02/09/2004  T:  02/09/2004  Job:  [id number]   cc:   Pramod P. Pearlean Brownie, MD  Fax: [phone]

## 2010-05-21 NOTE — Cardiovascular Report (Signed)
[employer] [facility]  Patient:    URIYAH, MASSIMO                     MRN: [id number] Proc. Date: 02/11/99 Adm. Date:  [id number] Attending:  Learta Codding                        Cardiac Catheterization  INDICATIONS:  Ms. Osterloh is a very pleasant 50 year old African-American female who presents with ongoing chest pain.  She has mild EKG changes with evidence of Q-waves in V1 and V2, and anterolateral ST-T wave abnormalities which are nondiagnostic.  She was seen by Dr. Andee Lineman and brought to the cardiac catheterization laboratory at [facility].  There, she underwent diagnostic catheterization by Dr. Andee Lineman, which revealed evidence of an acute occlusion of the left anterior descending artery beyond the origin of a ramus intermedius and septal perforator.  The distal vessel filled faintly by collaterals from the right circulation and also some very slow antegrade flow.  There was evidence of mild  plaquing proximally.  The right coronary and circumflex were without critical disease.  The left ventriculogram revealed an ejection fraction of 49% and anteroapical akinesis.  With this, the decision was made to perform emergency percutaneous coronary intervention.  She was consented and agreeable to enrollment in the X-TRACT acute MI registry.  DESCRIPTION OF PROCEDURE:  The patient had an indwelling 6-French sheath from her original procedure.  Heparin was given appropriately and the ACT was greater than 200 seconds.  ReoPro was given appropriately.  A JL-3.5 guiding catheter was used to cannulate the left main coronary.  The LAD was then crossed using a 0.014 high-torque floppy wire in which we were able to put the wire in the distal vessel. There did appear to be diffuse distal LAD disease at the apical portion of the vessel.  The totally occluded lesion was then attempted to cross with the X-CISER, but it was difficult to seat the X-CISER  right at the site of the lesion and we  were unable to cross.  We then predilated with a 2 mm CrossSail balloon. Antegrade flow was established.  We then passed the X-CISER for one pass across the area f stenosis and this was pulled back.  There was mild ST elevation with the device in place and, therefore, it was removed.  The lesion was then dilated using a 3 mm  balloon with some improvement.  We then used a 3 mm NIR Royale Elite stent which was placed just past the septal perforator and this was taken up to 16 atm. With this, there was marked improvement in the appearance of the artery.  With nitroglycerin, there was slight underdilatation of the distal portion of the stent and the stent was then post-dilated using a 3.5 mm Solaris balloon up to 18 atm  which yielded a 3.8 artery.  The mid stent was also dilated, but the proximal stent was slightly oversized because of diffuse plaquing of the proximal vessels distal to the septal perforator.  However, the vessel proximal to the stent was at least a 3 mm artery in terms of patency.  TIMI-3 flow was established.  There was evidence of diffuse apical LAD disease.  The final result was excellent.  RESULTS:  ANGIOGRAPHIC DATA: 1. The left main coronary artery was long.  This was free of disease.  2. The left anterior descending artery had segmental plaquing of approximately  30% to 40% from the origin out to the site of total occlusion.  This crossed  the septal perforator.  The grading of this was based upon the distal vessel    after reperfusion.  The area of total occlusion was subtotally occluded after    wire crossing and 2.0 dilatation and then was stented successfully to 0%    residual luminal narrowing after the stent was post-dilated.  The distal vessel    consisted of a large caliber 3.5 mm left anterior descending artery providing a    diagonal branch and an apical LAD.  The apical LAD appeared  diffusely diseased    and not really very amenable to percutaneous intervention because of the small    caliber in the apical portion.  3. Other incidental findings include a mild plaquing of the first marginal branch    just after its bifurcation into a smaller subbranch and a larger subbranch.  The remainder of the circumflex appeared without obvious critical disease.  As    noted, the right coronary artery also appeared to be free of critical disease.  CONCLUSIONS:  Successful percutaneous coronary intervention of a totally occluded left anterior descending artery with X-TRACT treatment and adjunctive stenting f the left anterior descending artery. DD:  02/11/99 TD:  02/11/99 Job: [id number]

## 2010-05-21 NOTE — Assessment & Plan Note (Signed)
MEDICAL RECORD NUMBER:  [id number].   This is a followup visit for [name] after her right MCA infarct. The  patient was on rehab briefly in February. Her main issue has regarded the  left arm. She had rather good function in the left leg. The left arm has  been getting stronger but still is slow in respect to motor return. She is  receiving outpatient therapies currently with PT and OT at [facility].  She denies any frank numbness in the left arm. She has had some left  shoulder pain which has been bothersome but not to the point where she needs  significant pain medication. She has been getting by on Darvocet currently.  She was changed to Darvocet from Ultram because the Ultram caused her to be  a little bit irritable and impaired her sleep. The patient is living at home  with family. The patient has no other specific complaints today.   SOCIAL HISTORY:  See pertinent positives from above.   REVIEW OF SYSTEMS:  The patient reports blurred vision, anxiety, depression,  weakness in the left arm. Denies any chest pain, shortness of breath, cold,  flu, wheezing, or coughing symptoms. Denies diarrhea, constipation, bowel or  bladder continence, fevers, chills or weight changes.   PHYSICAL EXAMINATION:  Blood pressure is 120/92, pulse is 47, respiratory  rate is 16. She is saturating 98% on room air. The patient walks with a  slight limp favoring the left side, but this is minimal. Affect is bright  and appropriate. Appearance is well kept. The patient had continued weakness  in the left upper extremity in the 2 to 2+/5 range. She has occasional  catches in the elbow and wrist today. I rated tone at trace out of 5.  Reflexes are diffusely increased on the left side. Left lower extremity  strength was 5/5 with some slight diminishment in fine motor coordination  but minimal. Sensory exam was intact. She had no focal cranial nerve  findings today. Speech was clear and articulate. No  aphasia was seen. No  focal visual deficiencies were note. Cognition was appropriate. Left  shoulder was painful along the short head of the biceps tendon.  HEART:  Regular rate and rhythm.  ABDOMEN:  Soft, nontender.  CHEST:  Revealed no wheezes, rales, or rhonchi. Auscultation was clear.   ASSESSMENT:  1.  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  2.  Hypertension.  3.  Coronary artery disease.  4.  Left bicipital tendinitis.   PLAN:  1.  I would like the patient to continue with her outpatient physical      therapy to completion.  2.  I see no reason for any injection or anything interventional with the      left shoulder at this point. Recommend arm strengthening and supportive      postures on the chair and bed.  3.  Continue Darvocet for breakthrough pain.  4.  The patient will continue on her aspirin and Coumadin for stroke      prophylaxis. She will be maintained on Toprol and hydrochlorothiazide      for hypertension.  5.  She will follow up with Dr. Modesto Charon and Dr. Pearlean Brownie regarding anticoagulation      and basic medical issues.  6.  I will see her back in about three months' time. I think this patient      would be an ideal  candidate for constraint-      induced therapy. I gave the patient a script to carry with her to      occupational therapy so we could potentially get this started.      ZTS/MedQ  D:  03/05/2004 19:37:55  T:  03/06/2004 10:54:16  Job #:  [id number]   cc:   Chelsea Primus  [address]  [place] [address]  Fax: [phone]   Maryla Morrow. Modesto Charon, M.D.  [address]  [place]  [place] [address]  Fax: [phone]

## 2010-05-21 NOTE — H&P (Signed)
NAME:  [name], [name]                        ACCOUNT NO.:  [id number]   MEDICAL RECORD NO.:  [id number]                   PATIENT TYPE:  INP   LOCATION:  NA                                   FACILITY:  [facility]   PHYSICIAN:  Tracie Harrier, M.D.              DATE OF BIRTH:  Feb 05, 1960   DATE OF ADMISSION:  DATE OF DISCHARGE:                                HISTORY & PHYSICAL   HISTORY OF PRESENT ILLNESS:  [name] is a 50 year old female gravida 3,  para 3, admitted for hysterectomy for an enlarged uterus and symptoms  consistent with uterine fibroids.  The patient was seen in the emergency  room recently with back and abdominal pain with bleeding and cramping.  A CT  scan at that time showed an enlarged uterus consistent with uterine fibroids  and secondary change.  The patient was seen in my office on September 29th  where she underwent evaluation for this.  Large uterine fibroids were noted,  different treatment options were reviewed, and she requested total abdominal  hysterectomy understandably.   The patient has a history of myocardial infarction 2001.  She saw Dr. Eduard Roux  on October 08, 2002 and was subsequently cleared for surgery cardiologically.  She underwent cardiac testing on day prior to surgery which was reassuring.  I am uncertain as to the type of testing this was.   I also placed the patient on iron at her first visit.  She has been taking  this for two weeks now, her recent hemoglobin in the office was 10.4.  She  declined oophorectomy.   MEDICAL HISTORY:  1. History of cardiovascular disease.  2. History of chronic hypertension.  3. History of myocardial infarction in 2001 status post cardiac stent 2001.   PAST SURGICAL HISTORY:  1. Cardiac stent placed 2001.  2. Foot surgery 1995.   OBSTETRICAL HISTORY:  Normal spontaneous vaginal delivery x3 at term.   SOCIAL HISTORY:  Negative for smoking or illicit drugs.  She does drink  occasional alcohol.   CURRENT MEDICATIONS:  Lipitor, Toprol, Benicar, hydrochlorothiazide, and  baby aspirin.   ALLERGIES:  None known.   PHYSICAL EXAMINATION:  VITAL SIGNS:  Stable, blood pressure 140/90, repeat  130/82.  GENERAL:  She is a well-developed, well-nourished female in no acute  distress.  HEENT:  Within normal limits.  NECK:  Supple without adenopathy or thyromegaly.  HEART:  Regular rate and rhythm with 1/6 systolic ejection murmur.  LUNGS:  Clear.  BREASTS:  Deferred.  ABDOMEN:  Soft and benign.  EXTREMITIES:  Neurologically grossly normal.  PELVIC:  Normal external female genitalia.  Vagina, cervix clear.  The  uterus is enlarged to about 18 weeks in size.  The adnexa is clear of mass.   ADMISSION DIAGNOSES:  1. Symptomatic uterine fibroids.  2. Significant coronary artery disease.  3. Status post myocardial infarction.   PLAN:  1. Total abdominal  hysterectomy.  2. Ovarian conservation.   DISCUSSION:  The risks and benefits of this surgery were explained to the  patient.  The risks of bleeding, infection, risk of injury to surrounding  organs is reviewed.  Questions are answered regarding this surgery and its  risks.  Most notably for her cardiovascular risk as well.  Ovarian  conservation will be observed at her request.                                               Tracie Harrier, M.D.    REG/MEDQ  D:  10/10/2002  T:  10/10/2002  Job:  [id number]

## 2010-05-21 NOTE — Discharge Summary (Signed)
NAMECHARITIE, HINOTE              ACCOUNT NO.:  [id number]   MEDICAL RECORD NO.:  [id number]          PATIENT TYPE:  IPS   LOCATION:  4010                         FACILITY:  [facility]   PHYSICIAN:  Ranelle Oyster, M.D.DATE OF BIRTH:  [birth year]   DATE OF ADMISSION:  01/19/2004  DATE OF DISCHARGE:  01/29/2004                                 DISCHARGE SUMMARY   DISCHARGE DIAGNOSIS:  1.  Right cardioembolic middle cerebral artery infarction.  2.  History of hypertension.  3.  History of coronary artery disease.  4.  History of hypercholesterolemia.  5.  History of splenic infarction.  6.  Cardiac thrombus at the apex per echocardiogram.   HISTORY AND PHYSICAL:  The patient is a 50 year old right handed black  female with a past medical history of hypertension, cardiovascular  disease/MI admitted on January 09, 2004, for evaluation of left face, arm  weakness, and gait disturbance.  Head CT revealed a subacute right frontal  MCA infarction per MRI.  MRA revealed no stenosis.  Echocardiogram revealed  large, mobile echodensity at the apex consistent with thrombus and left  ventricular wall thickness.  Diagnosis cardioembolic MCA infarction.  The  patient had follow up by neurology.   Problems included dysphagia, chest pain, abdominal pain.  Chest CT revealed  no PE.  CT of the abdomen revealed splenic infarction.  The patient has  received Refludan x 1 day and Coumadin.  Chest pain was relieved with  sublingual nitrate.  Consult by GI and cardiology.  Cardiac workup negative  except for some EKG changes.  PT report reveals patient is able to transfer  mod assist, bed mobility mod assist, she is stable to mins, transferred to  [facility] Department on January 19, 2004.   PAST MEDICAL HISTORY:  Past history significant for hypertension,  cardiovascular disease/MI, hypercholesterolemia.   PAST SURGICAL HISTORY:  Past surgical history significant for hysterectomy,  stent  placement, right rotator cuff repair.   FAMILY HISTORY:  Family history significant for hypertension.   SOCIAL HISTORY:  The patient is single, lives with daughter and grandson in  a one level home in [place], [place].  She has three children  living.  She works as a Financial planner at [employer].  She was independent prior  to admission.   MEDICATIONS PRIOR TO ADMISSION:  Toprol 20 mg daily, Lipitor 20 mg daily.   ALLERGIES:  None.   HOSPITAL COURSE:  Ms. Brigitta Pricer was admitted to [facility]  Department on January 19, 2004, for comprehensive patient rehabilitation  receiving more than three hours of therapy daily.  Hospital course  significant for:   Problem 1:  Right MCA cardioembolic infarction.  Overall, Ms. Cortina  progressed very well during her ten day stay in rehab.  She is discharged in  overall supervision level.  The patient remained on Coumadin for DVT  prophylaxis without any bleeding or complications noted.  The patient had no  significant other weaknesses while in rehab.  The patient was discharged  with still significant left upper extremity weakness but good mobility in  her  left lower extremity.  Blood pressure was under fairly reasonable  control with HCTZ and Toprol.  The patient was placed on Protonix 40 mg for  GI prophylaxis.   Problem 2:  History of elevated cholesterol.  She remained on Zocor 50 mg  daily.  No adjustment in this medication was necessary.  The patient's AST  32, ALT 25, and alkaline phos 74.   Problem 3:  History of hypertension.  The patient was initially on  hydrochlorothiazide as well as Avapro and Toprol.  Her blood pressures  remained stable and HCTZ was placed on hold.  On January 27, 2004, Avapro  was discontinued and the patient resumed HCTZ as Toprol.  It was previously  thought that the patient was taking HCTZ at home, but later found out the  patient was only taking Toprol.  She will be discharged on HCTZ as  well as  Toprol.   Problem 4:  The patient had been placed on a dysphagia II thin liquid diet  at the time of admission.  She received speech therapy daily.  Her diet was  finally advanced to a regular diet thin liquids on January 27, 2004.   Problem 5:  Insomnia.  The patient stated she had difficulty sleeping.  She  was initially placed on Trazodone 25 mg p.o. q.h.s.  This was discontinued  due to no good results.  She was started on Restoril 50 mg q.h.s. p.r.n.   Problem 6:  Cardiovascular disease.  The patient had no significant chest  pains while in rehab.  Follow up with PCP and cardiologist as needed.   The patient accomplished all therapies.  Her speech, easy cognition within  functional limits.  She had excellent progress in all goals.  Regular diet,  thin liquids and 100% speech intangible.  Discharged able to ambulate  greater than 50 feet with no assistive device.  Still had significant left  upper extremity weakness.  Able to perform most ADLS on supervision level.  The patient is discharged home with her family.  The latest hemoglobin is  14.2, hematocrit 42.3, white blood cell count 9.8, platelet count 396.  Sodium 133, potassium 3.7, chloride 197, CO2 25, glucose 100, BUN 23,  creatinine 1.1, AST 37, ALT 25, alkaline phos 74.  She had urine culture  performed on January 19, 2004, with multiple species present without any  uropathogens isolated.   DISCHARGE MEDICATIONS:  1.  Multi-vitamin one tablet daily.  2.  Zocor 50 mg daily.  3.  HCTZ 25 mg daily.  4.  Toprol 20 mg daily.  5.  Protonix 40 mg daily.  6.  Coumadin 4 mg daily.  7.  She is to take no aspirin or ibuprofen while on Coumadin.  8.  Pain management will be Tylenol with Ultram for occasional pain in her      back and neck.   DISCHARGE INSTRUCTIONS:  Activities:  No driving, alcohol or smoking.  Regular diet, thin liquids, decreased cholesterol, no salt.  See outpatient PT, OT, and speech at [facility] beginning January 31.  Speech  because she did experience some aphasia.  She will follow up with Dr.  Leodis Sias on Monday, January 30, at 11:12 a.m. to check PT and INR and to  monitor the Coumadin, follow up for physical exam.  The patient is to follow  up with Dr. Inda Merlin in three weeks.  Follow up with Dr. Faith Rogue on  March 05, 2004, at 1:20  p.m.      LB/MEDQ  D:  01/29/2004  T:  01/29/2004  Job:  [id number]   cc:   Thelma Barge P. Modesto Charon, M.D.  [address]  [place]  [place] [address]  Fax: [phone]   Pramod P. Pearlean Brownie, MD  Fax: [phone]

## 2010-05-21 NOTE — Assessment & Plan Note (Signed)
DATE OF VISIT:  May 03, 2004.   MEDICAL RECORD NUMBER:  [id number].   [name] is here in followup of a right MCA infarction.  She states that  she has been frustrated that her left arm has not improved like she hoped.  She feels still that it is rather weak.  She stopped therapy on her own  decision due to lack of progress and frustration.  She denies any numbness  in the hand.  She is going back to work.  The left shoulder is feeling  better.  She denies any significant pain there.  The patient states that she  is walking without assistance.  She has had no problems with gait imbalance.  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   REVIEW OF SYSTEMS:  The patient reports mood issues.  Today she told me that  she was not currently depressed, however.  Denies any fever, chills, weight  changes, coughing or wheezing.  The full review of systems is in the health  and history section of the chart.   SOCIAL HISTORY:  Pertinent positives are listed above.   PHYSICAL EXAMINATION:  VITAL SIGNS:  Her blood pressure was 187/116.  On  recheck it was 180/106.  The pulse was 53 and respiratory rate 18.  She is  saturating 100% on room air.  GENERAL APPEARANCE:  The patient is obese and in no acute distress.  Mood  seemed fair.  She is alert and oriented x 3.  She asked appropriate  questions.  Gait was stable with minimal limp.  She had a little more  problem with fine motor coordination.  HEART:  Regular rate and rhythm.  NEUROLOGIC:  Sensation was 2/2.  She did have some decreased fine motor  coordination of the left lower extremity and to a more significant extent  the left upper extremity.  There was trace tone at the wrist and fingers  today at trace to 1/4.  Motor function was 2+-3/5, which is an increase from  her last exam.  Left lower extremity strength was 5/5.  No word-finding  deficits or visual deficits were noted.  Cognition was appropriate.   The  left shoulder was painless to palpation and manipulation today.   ASSESSMENT:  1.  Right middle cerebral artery infarction with spastic left hemiparesis,      which is improving.  2.  Hypertension, which has persisted.  3.  Coronary artery disease.  4.  Left bicipital tenonitis, which is improved.   PLAN:  1.  The patient will return to work.  2.  I would like to consider constraint-induced therapies for this patient,      although I am not sure that these are available locally.  Will check      availability of at least individualized programs here.  The patient      prefers not to go to [place], [place].  3.  Continue Coumadin per Dr. Nash Dimmer direction.  4.  For blood pressure, will add Norvasc 5 mg daily in addition to her      Toprol and HCTZ, which are at 200 mg and 25 mg, respectively.  She needs      to follow up with Dr. Modesto Charon for further blood pressure management.  5.  I will see the patient back in about two months time to discuss      potential constraint-induced therapy.  She needs a break currently  from      her rehabilitation as I see it.      ZTS/MedQ  D:  05/03/2004 15:04:15  T:  05/03/2004 16:37:55  Job #:  [id number]   cc:   Chelsea Primus  [address]  [place] [address]  Fax: [phone]   Maryla Morrow. Modesto Charon, M.D.  [address]  [place]  [place] [address]  Fax: [phone]

## 2010-05-21 NOTE — Discharge Summary (Signed)
[name], [name]              ACCOUNT NO.:  [id number]   MEDICAL RECORD NO.:  [id number]          PATIENT TYPE:  INP   LOCATION:  3029                         FACILITY:  [facility]   PHYSICIAN:  Gustavus Messing. Orlin Hilding, M.D.DATE OF BIRTH:  1960/09/16   DATE OF ADMISSION:  01/09/2004  DATE OF DISCHARGE:  01/19/2004                                 DISCHARGE SUMMARY   INCOMPLETE:   DISCHARGE DIAGNOSES:  1.  Right middle cerebral artery infarction, secondary to cardiac thrombus.  2.  Splenic infarction, secondary to cardiac thrombus.  3.  Cardiomyopathy with an ejection fraction of 35%.  4.  Coronary artery disease.  5.  Thrombocytopenia with negative heparin-induced thrombocytopenia panel;      however, occurred on heparin.  6.  Hypertension.  7.  Dyslipidemia.  8.  Obesity.  9.  Status post cardiac stent x2.  10. Status post right rotator cuff repair.   DISCHARGE MEDICATIONS:  1.  Coumadin per pharmacy protocol.  2.  Metoprolol XL 200 mg daily.  3.  Hydrochlorothiazide 25 mg daily.  4.  Zocor 60 mg daily.  5.  Protonix 40 mg b.i.d.  6.  Avapro 150 mg daily.      SB/MEDQ  D:  01/19/2004  T:  01/19/2004  Job:  [id number]   cc:   Pramod P. Pearlean Brownie, MD  Fax: [phone]   Learta Codding, M.D. [facility]   Francis P. Modesto Charon, M.D.  [address]  [place]  [place] [address]  Fax: [phone]   Wilhemina Bonito. Marina Goodell, M.D. [facility]

## 2010-05-21 NOTE — Discharge Summary (Signed)
[name]              ACCOUNT NO.:  [id number]   MEDICAL RECORD NO.:  [id number]          PATIENT TYPE:  INP   LOCATION:  3029                         FACILITY:  [facility]   PHYSICIAN:  Gustavus Messing. Orlin Hilding, M.D.DATE OF BIRTH:  07-18-1960   DATE OF ADMISSION:  01/09/2004  DATE OF DISCHARGE:  01/19/2004                                 DISCHARGE SUMMARY   CONTINUATION:   STUDIES PERFORMED:  1.  CT of the brain on admission shows a right frontal infarct.  2.  MRI of the brain shows a subtotaled right middle cerebral artery      territory stroke with old small vessel insult in the cerebellum.  3.  MRA of the head shows some atherosclerotic irregularity in the carotid      siphon regions right more than left and a missing middle cerebral artery      branches on the right consistent with stroke.  4.  MRA of the stroke shows technically suboptimal MR with virtually no      information available.  5.  Abdominal x-ray shows no acute abnormality.  6.  CT angio of the chest shows no pulmonary embolism, changes of underlying      pulmonary venous hypertension without overt air space edema.  7.  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  8.  CT of the pelvis is negative.  9.  EKG on admission shows normal sinus rhythm with left axis deviation,      pulmonary disease pattern, improved T-wave in the lateral lead, septal      infarct age undetermined with no significant change since last tracing      on October 24, 2002.  10. EKG performed on January 11 at 11:29 a.m. when patient had chest pain      showed ST changes that have worsened.  Follow-up EKG done on the 11th at      3:58 p.m. continued to show the ST changes but they were improving.      Follow-up EKG on January 13 shows unchanged T-waves.  11. 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   LABORATORY STUDIES:  INR day of discharge 2.1 with a PT of 20.1.  CBC with  white blood cells ranging from 9.0 up to 14.7 and platelets ranging from 274  to 112.  Initial differential had 81 neutrophils, otherwise normal.  Chemistry with potassium ranging 3.9, dropped down to 2.9.  Glucose ranging  84 to 109 and albumin 3.3-3.4.  AST on admission was 40 down to 23 and rest  of chemistry is normal.  Homocysteine 6.87.  Cardiac enzymes normal.  Lipid  profile with cholesterol 271, triglycerides 128, HDL 35,  and LDL 211.   HISTORY OF PRESENT ILLNESS:  [name] is a 50 year old right-  handed black female with history of hypertension, coronary artery disease,  and MI who presents with history of left face and arm weakness, numbness,  and gait disturbance with falls that began around 9 p.m. the evening of  admission.  The patient claims that she had not been feeling well most of  the day and had a headache that she noted began around 4 p.m., but worsened  around 9 p.m.  She was brought to the emergency room for evaluation at 11  p.m.  CT of the head already shows acute infarct in the right frontal  region.  Neurology was asked to evaluate.  She will be admitted for further  work-up.  She is not a TPA or SAINT II candidate secondary to most likely  time of onset at 4 p.m.   HOSPITAL COURSE:  MRI did reveal an acute infarct not in the region of the  initial CT infarct so patient was started on IV heparin for presumed embolic  source as she had strokes in two different distributions.  2-D  echocardiogram confirmed embolic source noting a clot in the left  ventricular region with decreased ejection fraction to 30-35%.  Cardiology  was asked to evaluate for source of low ejection  fraction and at time of  consult patient complained of left-sided abdominal pain.  They asked GI to  see her and patient was found to have left splenic infarct.  Related to clot  in the heart they recommended IV heparin to Coumadin with follow-up by Dr.  Andee Lineman as an outpatient with some blood pressure adjustment.  GI was  agreeable with this with the splenic type infarct.  On the 11th patient  began to complain of chest pain in the late morning.  EKG was done that  showed some ST changes and cardiology was called.  She was transferred to  the unit to rule out MI for which she was negative.  They also ruled out  pulmonary embolus.  While on IV heparin patient's platelets dropped from the  upper 200s to the lower 1-teens.  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  Coumadin was initially held in case cardiology  needed to go in related to her heart but that ruled out to be medically  management only so patient was started on Refludan until she became  therapeutic on Coumadin.  The initial heparin-induced thrombocytopenia panel  was normal, but still felt like that was most likely the cause of her  platelet drop (Dr. Samule Ohm has seen a number of false negatives for the rapid  heparin-induced thrombocytopenia panel).  Assay has been rechecked).   Patient became stable from cardiac standpoint and was transferred back to  the floor.  She will need rehabilitation for her stroke prior to discharge  home and cardiology will continue to follow her blood pressure medicines  during the course of her hospitalization.  Goal INR is 2.5-3 and she needs  other risk factor control including dyslipidemia and obesity.  Plans are for  medical management of coronary artery disease.   No evidence of type 2 heparin-induced thrombocytopenia given platelet  response, probably type 1 (a heparin binding of platelets without antibody  reaction).  CONDITION ON DISCHARGE:  Patient with left  facial weakness, 0-5 strength in  the left upper extremity, and 4+/5 in the left lower extremity.  Strength is  normal on  the right.  She has no field cut.  Her sensation is intact.  She  has a left central 7th.   DISCHARGE PLAN:  1.  Transfer to rehabilitation for continuation of PT/OT and speech therapy.  2.  Coumadin for secondary stroke prevention and treatment of cardiac      thrombus and splenic infarct.  3.  Continue tight risk factor control.  4.  Cardiology to monitor blood pressure.  5.  Follow up with cardiology and primary physician as indicated.  6.  Follow up with Dr. Pearlean Brownie in two to three months after discharge from      rehabilitation.      SB/MEDQ  D:  01/19/2004  T:  01/19/2004  Job:  [id number]

## 2010-05-21 NOTE — Op Note (Signed)
NAMESHATERRIA, SAGER                        ACCOUNT NO.:  [id number]   MEDICAL RECORD NO.:  [id number]                   PATIENT TYPE:  INP   LOCATION:  9309                                 FACILITY:  [facility]   PHYSICIAN:  Tracie Harrier, M.D.              DATE OF BIRTH:  [birth year]   DATE OF PROCEDURE:  05/08/2003  DATE OF DISCHARGE:                                 OPERATIVE REPORT   PREOPERATIVE DIAGNOSES:  1. Abnormal uterine bleeding.  2. Uterine fibroids.  3. Significant coronary artery disease.   POSTOPERATIVE DIAGNOSES:  1. Abnormal uterine bleeding.  2. Uterine fibroids.  3. Significant coronary artery disease.   PROCEDURE:  Total abdominal hysterectomy.   SURGEON:  Tracie Harrier, M.D.   ASSISTANT:  Duke Salvia. Marcelle Overlie, M.D.   ANESTHESIA:  General.   ESTIMATED BLOOD LOSS:  100 cc.   COMPLICATIONS:  None.   FINDINGS:  At the time of hysterectomy, an enlarged uterus consistent with  uterine fibroids was noted approximately 10 weeks in size.  The ovaries were  visualized and noted to be normal.   PROCEDURE:  The patient was taken to the operating room where a general  endotracheal anesthetic was administered.  The patient was placed on the  operating table in the supine position.  The abdomen was prepped and draped  in the usual sterile fashion with Betadine and sterile drapes.  A Foley  catheter was inserted.  The abdomen was entered through a Pfannenstiel  incision and carried down sharply in the usual fashion.  The peritoneum was  atraumatically entered.  The bowel was packed away with laparotomy packs and  a self-retaining retractor was placed.  The cornual regions of the uterus  were grasped with two large Kelly clamps.  The uterus was then elevated into  the incision.  Next, the uterine ovarian ligaments were clamped with the  ligature device and cauterized thoroughly.  The uterus was then dissected  free sharply using the Mayo scissors.  This was done  bilaterally.  A bladder  flap was sharply created over the lower uterine segment and advanced well  inferior to the junction of the cervix and the vagina.  Successive bites  were then carried down the body of the uterus.  The uterine artery pedicles  were skeletonized bilaterally and clamped with a LigaSure device and  cauterized thoroughly bilaterally.  These were then sharply created.  Successive bites were then carried down the body of the uterus until the  vagina was met.  All vascular pedicles were created very close to the  uterine body and all vascular pedicles were cauterized with the LigaSure  device and divided sharply.   When the vagina was met, two curved Heaney clamps were used to clamp the  vagina and the uterus dissected free in its total.  The vagina was then  closed first with transfixing suture of 0 Vicryl at the vaginal  angles.  This was done bilaterally.  The vaginal cuff was then closed with multiple  interrupted sutures of 0 Vicryl.  The pelvis was then thoroughly irrigated  and noted to be hemostatic.  Attention was then turned to closure.  All  abdominal instruments and the packs were removed completely.   The rectus muscle and anterior peritoneum were closed with a running suture  of #1 Vicryl.  The fascia was then closed with two sutures of #1 Vicryl in  running fashion.  This was done by placing two sutures at the periphery of  the incision and with two running stitches meeting in the midline.  The  subcutaneous tissue was irrigated and made hemostatic using the Bovie  cautery.  The skin reapproximated with staples and a sterile dressing  applied.   Final sponge, needle and instrument counts correct x 3.  There were no  perioperative complications.  The patient received an antibiotic  preoperatively.                                               Tracie Harrier, M.D.    REG/MEDQ  D:  05/09/2003  T:  05/09/2003  Job:  [id number]

## 2010-05-21 NOTE — Consult Note (Signed)
[name], [name] NO.:  [id number]   MEDICAL RECORD NO.:  [id number]          PATIENT TYPE:  INP   LOCATION:  3733                         FACILITY:  [facility]   PHYSICIAN:  Iva Boop, M.D. LHCDATE OF BIRTH:  02/26/1960   DATE OF CONSULTATION:  01/12/2004  DATE OF DISCHARGE:                                   CONSULTATION   REQUESTING PHYSICIAN:  Gustavus Messing. Orlin Hilding, M.D.   REASON FOR CONSULTATION:  New abdominal pain.   HISTORY:  [name] is a 50 year old African-American woman who was  admitted on January 09, 2004, with an acute right middle cerebral artery  stroke.  She had been doing reasonably well with some improvement.  She was  found to have a mobile thrombus in her left ventricle and is on heparin and  Coumadin.  She does have a past medical history of coronary artery disease  with stenting in 2004, hypertension, dyslipidemia, obesity, history of  anemia, history of MI in 2001 with a stent to the LAD.  She has had total  abdominal hysterectomy in the past year, foot surgery, and right rotator  cuff surgery.   She has been complaining of left-sided abdominal pain in the left upper  quadrant since after lunch today when she had beans.  I think she may have  been passing gas, but it not clear how many bowel movements she may have  had.  There has been no report of bleeding.  She has not had vomiting or  nausea.  Her vital signs are stable.   She underwent abdominal films, which were unremarkable, and had a CMET, CBC,  amylase, and lipase tonight which were unrevealing except for a potassium of  2.9.  Her  white count was slightly up at 13.2.  She has been given some  hydrocodone for abdominal pain, and it has improved.  She still complains of  pain, however,  She denies history of ulcer disease or abdominal pain like  this.  It is a little bit hard to get the history in the setting of a  stroke.  I do not think she has an aphasia, but she does  have some  difficulty communicating.  She may be a little bit sedated from her  analgesics as well.   Dr. Orlin Hilding called me as radiology indicated she did not sit still for her  abdominal films, and they thought the previously ordered CT scan without  contrast would be better with contrast.   MEDICATIONS:  1.  At this point, heparin drip.  2.  Coumadin.  Her INR is still normal.  3.  She is on Zocor 60 mg daily.  4.  Hydrochlorothiazide 25 mg daily.  5.  Metoprolol 200 mg daily.  6.  Catapres patch 0.2 mg.  7.  Hydrocodone p.r.n. pain.  8.  Avapro 75 mg daily.   ALLERGIES:  No known drug allergies.   REVIEW OF SYSTEMS:  It sounds like she has been having some headaches.  She  has had speech pathology evaluation.  Not sleeping well at night.  She was  having some oropharyngeal dysphagia and needed supervision with eating, but  did not have overt aspiration, it looks like.  She apparently had presented  with headache and dizziness.  She has upper and lower dentures.   As best I can tell, all other systems are negative, though difficult to  obtain beyond that at this point given the limited ability to converse.   SOCIAL HISTORY:  She lives alone in [place].  She is a Firefighter.  No alcohol or tobacco.  Three children.   FAMILY HISTORY:  Hypertension in both parents.   PHYSICAL EXAMINATION:  GENERAL:  An obese black woman in no acute distress.  VITAL SIGNS:  Show that she is afebrile with a temperature of 98.2, pulse  68, respirations 18, blood pressure 138/78.  EYES:  Anicteric.  NEUROLOGIC:  There is a left facial droop.  She follows simple commands.  She has left upper extremity weakness and hemiparesis.  LUNGS:  Clear.  HEART:  S1, S2. No rubs, murmurs, or gallops.  ABDOMEN:  Obese, soft.  She has mild to moderate tenderness, though I can  palpate deeply in the left upper quadrant.  I cannot feel the spleen.  Surgical scars from prior hysterectomy are noted.  The  bowel sounds are  present but somewhat diminished.  EXTREMITIES:  No edema.  SKIN:  Without rash.  LYMPH NODES:  No neck adenopathy detected.  ENT:  Oropharynx is clear at this time.   LABORATORY DATA AND OTHER STUDIES:  Lab data:  As mentioned in the HPI.   X-rays:  As mentioned in the HPI.   She has had a MRI and MRA of the neck which has demonstrated a subtotal  right middle cerebral artery territory acute stroke, old small vessel insult  in the cerebellum, some atherosclerotic irregularity in the carotid siphon  region, more on the right than the left, and suboptimal angiography of the  neck vessels.   IMPRESSION:  Acute onset left upper quadrant pain in a woman who was  admitted with an embolic stroke with a thrombus in her left ventricle.  She  certainly could have had another embolus to the spleen or perhaps the  intestines.  Laboratories are reassuring at this point other than a mildly  elevated white count.  There is no sign of acidosis.  Amylase and lipase are  normal.  I would favor a splenic infarct.  Other possibilities include  gastritis or some sort of dietary-induced pain, though that seems less  likely at this point.  She has been having symptoms since lunch time.  She  is somewhat better with some pain medication.  She does not have an acute  surgical abdomen at this time.   PLAN:  1.  I would go ahead the CT and try to give her the oral contrast and do it      without and with IV contrast.  I have discussed this with Dr. Orlin Hilding.  2.  She has hypokalemia which I will supplement, and the neurologist can      follow up on that tomorrow.  3.  Recheck labs in the morning.  4.  Further plans pending clinical course.  5.  Maalox intermittently p.r.n. for the time being.  I do not think a      proton pump inhibitor would make a big difference overnight, though we      can try that and start that empirically for the time being while we are  sorting out this  issue.       CEG/MEDQ  D:  01/12/2004  T:  01/12/2004  Job:  [id number]   cc:   Santina Evans A. Orlin Hilding, M.D.  [address]  [address]  [place]  [place] [address]  Fax: [phone]   Maryla Morrow. Modesto Charon, M.D.  [address]  [place]  [place] [address]  Fax: [phone]

## 2010-05-21 NOTE — Consult Note (Signed)
[name], Pace              ACCOUNT NO.:  [id number]   MEDICAL RECORD NO.:  [id number]          PATIENT TYPE:  INP   LOCATION:  3733                         FACILITY:  [facility]   PHYSICIAN:  [name], M.D. [facility] OF BIRTH:  [birth year]   DATE OF CONSULTATION:  DATE OF DISCHARGE:                                   CONSULTATION   CHIEF COMPLAINT:  Stroke.   HISTORY OF PRESENT ILLNESS:  The patient is a 50 year old African American  female with a history of myocardial infarction.  In 2001, she underwent  emergent angioplasty of the left anterior descending artery with stenting by  me.  She had successful reperfusion.  She had some recurrent symptoms, and  was preoperative in late 2004 and at that time, had a repeat dilatation of  her LAD.  She denies any recent chest pain and  was on vacation.  She had a  stressful day at work and then subsequently had a CVA that night.  She has  had no recent specific symptoms.  Repeat echocardiogram  has demonstrated a  large apical mural thrombus which is well documented in the note.  In  addition, the ejection fraction has fallen from 45% down to 30 to 35%.  She  denies specific symptoms.   Catheterization in 2004, revealed a CYPHER stent in the LAD territory.  She  had a CYPHER stent placed in the LAD territory.   Residual ejection fraction was 40% by Cardiolite in October of 2004.  The  estimate in 2004 by catheterization was 45%, a 70 to 80% in-stent restenosis  was redilated with a drug eluting stent and the apical wall motion  abnormality was present.  She has been admitted to the hospital, and with a  stroke thought to be in the distribution of the right middle cerebral  artery.  She has had some mild abdominal discomfort this afternoon.   PAST MEDICAL HISTORY:  1.  Prior MI and cardiac work-up is noted.  2.  History of hypertension.  3.  Hyperlipidemia.  4.  History of obesity but no tobacco use.  5.  History of anemia with  hemoglobin of about 11 when she saw Dr. Andee Pace.   The patient has three children.  She lives alone.  She is a Medical sales representative.  She does not smoke or drink.   MEDICATIONS:  1.  Catapres patch 0.2 mg daily.  2.  Toprol 200 daily.  3.  Hydrochlorothiazide 25 daily.  4.  Zocor 60.  5.  Coumadin.  6.  Avapro 75 daily.  7.  Heparin drip.   FAMILY HISTORY:  Remarkable for a mother who had hypertension.  Father had  hypertension.   REVIEW OF SYMPTOMS:  Remarkable for new numbness.   PHYSICAL EXAMINATION:  GENERAL APPEARANCE:  She is an alert and oriented  female.  She is well-developed and nourished.  VITAL SIGNS:  Blood pressure 120/90, pulse 58, respiratory rate 18,  temperature 98.9, SAO2 96%.  HEENT:  Unremarkable.  CARDIOVASCULAR:  PMI is not displaced.  First and second heart sounds are  normal.  There is an S4 gallop.  ABDOMEN:  The abdomen reveals some present bowel sounds.  There is some  slight diffuse tenderness but none of it is very severe.   Recent cholesterol was 271 with an LDL of 211, HDL of 35 and triglycerides  of 123.   Electrocardiogram demonstrates anteroseptal MI of indeterminate age with  left oriented axis.   Homocystine is 6.87.   IMPRESSION:  1.  Right middle cerebral artery  cerebrovascular accident with large apical      mural thrombus noted by echocardiography.  2.  Prior anterior wall myocardial infarction, treated with primary      angioplasty with subsequent repeat dilatation.  3.  Moderate left ventricular dysfunction.   PLAN:  The patient is on heparin and Coumadin.  She has had onset of some  discomfort this afternoon and I have notified Dr. Pearlean Brownie about this.  He will  recheck her in a little bit to see if this is not resolved.  If not,  surgical consultation may be necessary.  With regard to the current  management, she would be treated medically with heparin and Coumadin.  I  will talk with Dr. Andee Pace about her  status.       TDS/MEDQ  D:  01/12/2004  T:  01/12/2004  Job:  [id number]   cc:   Learta Codding, M.D. [facility]   Pramod P. Pearlean Brownie, MD  Fax: [phone]

## 2010-05-21 NOTE — Discharge Summary (Signed)
NAMEHARBOUR, NORDMEYER              ACCOUNT NO.:  [id number]   MEDICAL RECORD NO.:  [id number]          PATIENT TYPE:  INP   LOCATION:  3014                         FACILITY:  [facility]   PHYSICIAN:  Pramod P. Pearlean Brownie, MD    DATE OF BIRTH:  17-Jul-1960   DATE OF ADMISSION:  02/09/2004  DATE OF DISCHARGE:  02/10/2004                                 DISCHARGE SUMMARY   DISCHARGE DIAGNOSES:  1.  Vertigo resolved.  2.  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  3.  Apical thrombus therapeutic on Coumadin.  4.  Hypertension.  5.  Coronary artery disease.  6.  Dyslipidemia.  7.  Splenic infarct.   MEDICATIONS AT DISCHARGE:  1.  Aspirin 81 mg a day.  2.  Coumadin 4 mg a day.  3.  Hydrochlorothiazide 25 mg a day.  4.  Protonix 40 mg a day.  5.  Zocor 60 mg a day.  6.  Toprol XL 200 mg a day.   STUDIES PERFORMED:  1.  CT of the brain on admission showed old right MCA distribution infarct      with petechial hemorrhagic transformation. Subacute strokes in both      occipital lobes new from prior exam but not acute.  2.  MRI of the brain shows normal extension of previously seen right MCA      territory infarct involving the basal ganglia on the right __________      and caudate. Some extension along the superior margin since initial MRI      but not acute. There are some subacute infarct in both occipital lobes      not present on study January 2006 but probably happened shortly      thereafter.  3.  MRA of the brain shows no significant change in appearance of the MRA      when compared to a month ago.  4.  EKG normal sinus rhythm, left axis deviation, anterior infarct, age      undetermined, no significant change since last tracing.   LABORATORY STUDIES:  CBC normal.  CMET normal except for slightly low  albumin at 3.2. Differential normal.  Urine drug screen normal. Urinalysis  with 3-6 white blood cells, 0-2 red blood cells, many bacteria.  Hypercoagulable panel was drawn, results are pending.   HISTORY OF PRESENT ILLNESS:  [name] is a 50 year old right  handed black female who had a right MCA stroke on January 6. That stroke was  found to be secondary to a cardiac thrombus for which she is taking Coumadin  with an INR of 2.5.  She went to rehab after acute hospitalization and  returned home on January 26. The morning of admission, she states she woke  up from a nap while turning in bed from side to side and had sudden onset of  severe vertigo.  The bed was spinning and she became nauseated and clammy.  She presented to the emergency room, a CT of the head showed new lesions in  the occipital lobes bilaterally  that were not present in January.  She was  admitted to the hospital for further workup.   HOSPITAL COURSE:  MRI showed no acute infarcts. Bilateral occipital infarcts  were felt to have been just after her initial stroke in January.  May have  happened at the time of her splenic infarct.  Vertigo resolved after  hospital admission. She did complain of some paresthesias on the right side  which have also resolved. She had one five minute spell of right thigh  tingling since admission but nothing otherwise significant. She got up in  the hall and walked 200 feet with physical therapy. She was slightly more  wobbly than she was when she left rehab but does have 24 hour care and out  therapy arranged at home and desires to go home. A decision was made to  discharge the patient home. She will continue to followup with Dr. Riley Kill  primary doctor and Dr. Pearlean Brownie as previous.  Aspirin was added to her regimen  for secondary stroke prevention.   CONDITION ON DISCHARGE:  The patient alert and oriented x3. She had some  mild dysarthria but no aphasia. She had left upper extremity weakness, 0-5  distally and 2/5 in her shoulder. Her gait is slightly unsteady without  assistance. Her chest is clear to auscultation,  heart rate is regular.   PLAN:  1.  Discharge home.  2.  Continue out therapies.  3.  Add baby aspirin to current Coumadin regimen.  4.  Followup with Francis P. Modesto Charon, M.D. as per him.  5.  Followup with Pramod P. Pearlean Brownie, MD in two months, call for an      appointment.  6.  Continue monitoring Coumadin to keep INR therapeutic.      SB/MEDQ  D:  02/10/2004  T:  02/10/2004  Job:  [id number]   cc:   Chelsea Primus  [address]  [place] [address]  Fax: [phone]   Maryla Morrow. Modesto Charon, M.D.  [address]  [place]  [place] [address]  Fax: [phone]   Ranelle Oyster, M.D.  [address] [place]  [place] [address]  Fax: [phone]   [facility]

## 2010-05-21 NOTE — Discharge Summary (Signed)
NAMEAMADEA, [name]                        ACCOUNT NO.:  [id number]   MEDICAL RECORD NO.:  [id number]                   PATIENT TYPE:  OIB   LOCATION:  6525                                 FACILITY:  [facility]   PHYSICIAN:  Charlies Constable, M.D.                  DATE OF BIRTH:  1960/01/08   DATE OF ADMISSION:  10/23/2002  DATE OF DISCHARGE:  10/24/2002                                 DISCHARGE SUMMARY   DISCHARGE DIAGNOSES:  1. Coronary artery disease.     A. History of anterior wall myocardial infarction, treated with        percutaneous coronary intervention.     B. Recent abnormal stress Cardiolite done in preparation for surgery.     C. Status post Cypher stent placement to the proximal left anterior        descending secondary to 70 to 80% in-stent restenosis.     D. Ejection fraction 45% with apical akinesis.  2. Uterine fibroids, resultant iron deficiency anemia.     A. Future plans for hysterectomy.  3. Treated dyslipidemia.   PROCEDURE:  Cardiac catheterization, percutaneous coronary intervention by  Dr. Juanda Chance on October 23, 2002.  Catheterization; LAD with 70 to 80%  proximal in-stent restenosis, circumflex okay, RCA okay.  Left  ventriculogram revealing apical akinesis and EF of 45%.  Percutaneous  coronary intervention; status post Cypher stent placement to the proximal  LAD with reduction of 70 to 80% stenosis to 10%.   HOSPITAL COURSE:  Please see the office note from Learta Codding, M.D. on  October 22, 2002, for complete details.  Briefly, this 50 year old female  with a history of coronary artery disease, status post anterior myocardial  infarction treated with percutaneous coronary intervention was evaluated  preoperatively with a stress perfusion study. She has bleeding uterine  fibroids and needs a hysterectomy.  Her Cardiolite was abnormal.  She was  referred for further evaluation with cardiac catheterization.  The procedure  was done by Dr. Juanda Chance on October 23, 2002.  She subsequently underwent  Cypher stent placement to the proximal LAD. There were no immediate  complications.  The patient tolerated the procedure well.  On the morning of  October 24, 2002, the patient was seen by Dr. Juanda Chance. He felt the patient  was stable enough for discharge to home.  He recommended Plavix for a total  of three months.  He recommended postponing the hysterectomy for three  months. The patient will need follow up with Dr. Andee Lineman in the next two  weeks and her gynecologist in the next one to two weeks.   LABORATORY DATA:  White count 5600, hemoglobin 10.3, hematocrit 30.9,  platelet count 342,000, MCV 79.5.  Sodium 138, potassium 3.6, chloride 108,  CO2 26, glucose 94, BUN 4, creatinine 0.9, calcium 8.3.  Cardiac enzymes; CK-  MB negative x3, troponin I 0.01, 0.02, 0.05.   Chest  x-ray from October 6; no active cardiopulmonary disease, scarring in  the lingula.   DISCHARGE MEDICATIONS:  1. Plavix 75 mg daily for three months.  2. Aspirin 325 mg daily.  3. Lipitor 20 mg daily.  4. Benicar 40 mg daily.  5. Hydrochlorothiazide 25 mg daily.  6. Toprol XL 200 mg daily.  7. Chromagen.  8. Omega-3.  9. Hemocyte iron with folic acid.  10.      Nitroglycerin for recurrent chest pain.  11.      Pain management; Tylenol as needed.   The patient is to use nitroglycerin as needed for chest pain.  She is to  call our office or 911 for recurrent chest pain.   ACTIVITY:  No heavy lifting, driving, exertion, or sex for one week.  She is  to slowly advance as tolerated.   DIET:  Low fat, low sodium.   WOUND CARE:  The patient is to call the office for any groin swelling,  bleeding, or bruising.   DISCHARGE INSTRUCTIONS:  The patient was enrolled in the STEEPLE trial  during this admission.  She has been advised to continue her enteric-coated  aspirin at 325 mg daily. She will be scheduled with follow-up with the  research staff.   FOLLOW UP:  The patient  will see Dr. Andee Lineman on November 11, 2002, at 10:45  a.m.  The patient has been advised to follow up with her gynecologist in the  next one to two weeks to discuss rescheduling her hysterectomy three months  from now.      Tereso Newcomer, P.A.                        Charlies Constable, M.D.    SW/MEDQ  D:  10/24/2002  T:  10/24/2002  Job:  [id number]   cc:   Thelma Barge P. Modesto Charon, M.D.  [address]  [place]  [place] [address]  Fax: [phone]   Tracie Harrier, M.D.  [address] [place]  [place] [address]  Fax: [phone]

## 2010-05-21 NOTE — Discharge Summary (Signed)
[facility]  Patient:    [name], [name]                     MRN: [id number] Adm. Date:  [id number] Disc. Date: 04/20/99 Attending:  Learta Codding Dictator:   Lavella Hammock, P.A. CC:         Lewayne Bunting, M.D.             Leodis Sias, M.D.                           Discharge Summary  DATE OF BIRTH:  08-08-1960  PROCEDURES:  None.  HOSPITAL COURSE:  Ms. Oshima is a 50 year old African-American female with a history of coronary artery disease who had onset of left-sided chest pain on the Tuesday before admission.  It was episodic and the pain occurred only with supine position, deep inspiration, or movement of her left arm.  It was 6/10 at its worse and described as a "spasm," as well as "sharp" and a "stinging" pain.  Her lower left chest wall was tender to palpation and she also had a history of a dry cough but no other upper respiratory infection symptoms, as well as no documented fever.  She stated the pain was like her MI pain.  She was admitted to rule out MI and for further evaluation.  She had another episode of pain during the night but stated that Toradol gave her the best relief from this.  The next day, her pain was much better and she was considered stable for discharge on April 20, 1999.  LABORATORY DATA:  Hemoglobin 12.5, hematocrit 37.5, WBC 7.4, platelets 278. Sodium 138, potassium 3.9, chloride 108, CO2 25, BUN 14, creatinine 1.0. Serial CK-MB and troponin-I negative for MI.  Chest x-ray:  Poor inspiratory effort but no active disease.  CONDITION ON DISCHARGE:  Improved.  CONSULTATIONS:  None.  COMPLICATIONS:  None.  DISCHARGE DIAGNOSES: 1. Chest pain, felt to be chest wall pain, continue current medications and    add Motrin and follow up with Dr. Andee Lineman, as well as Dr. Modesto Charon. 2. History of myocardial infarction in February 2001, status post percutaneous    transluminal coronary angioplasty and stent to the left  anterior    descending. 3. Left ventricular dysfunction with an ejection fraction of 35-45% by    catheterization and 37% by resting MUGA scan. 4. Hypertension. 5. Hyperlipidemia. 6. Obesity.  DISCHARGE INSTRUCTIONS:  Her activity level is to be gradually increased according to the post-MI exercise guidelines.  She is to stick to a diet that is low in fat and calories.  FOLLOW-UP:  She is to follow up with Dr. Andee Lineman on Wednesday, May 12, 1999, at 12:15.  She is to follow up with Dr. Modesto Charon as needed.  DISCHARGE MEDICATIONS: 1. Coated aspirin 325 mg q.d. 2. Toprol XL 100 mg q.d. 3. Altace 5 mg 2 tablets q.d. 4. Lipitor 10 mg q.d. 5. Nitroglycerin 0.4 mg sublingual p.r.n. 6. Ibuprofen 600 mg t.i.d. for seven days p.r.n. DD:  04/20/99 TD:  04/20/99 Job: [id number]

## 2010-05-21 NOTE — H&P (Signed)
NAMEANGELIYAH, [name]                        ACCOUNT NO.:  [id number]   MEDICAL RECORD NO.:  [id number]                   PATIENT TYPE:  INP   LOCATION:  NA                                   FACILITY:  [facility]   PHYSICIAN:  Tracie Harrier, M.D.              DATE OF BIRTH:  02/14/1960   DATE OF ADMISSION:  05/08/2003  DATE OF DISCHARGE:                                HISTORY & PHYSICAL   HISTORY OF PRESENT ILLNESS:  [name] is a 50 year old female, gravida 3,  para 3.  She has known uterine fibroids, and is admitted for surgical  correction of this.  She with scheduled for this last year, when she was  noted to have significant cardiac findings.  She underwent cardiac  evaluation by Dr. Andee Lineman, and he has now cleared her for surgery.  She does  have significant coronary artery disease, however.   From a gynecologic standpoint, she continues to have abnormal bleeding and  significant uterine fibroids.   MEDICAL HISTORY:  1. History of MI.  2. History of coronary artery disease.  3. History of anemia.  4. History of dyslipidemia.   OBSTETRICAL HISTORY:  Normal spontaneous vaginal delivery x3 at term.   SURGICAL HISTORY:  1. History of cardiac stent placement in 2001.  2. Foot surgery in 1995.   CURRENT MEDICATIONS:  1. Lipitor.  2. Benicar.  3. Hydrochlorothiazide.  4. Toprol.  5. Iron.  6. Omega 3 fatty acids.  7. Nitroglycerin p.r.n.  8. Vicodin p.r.n.   ALLERGIES:  None known.   PHYSICAL EXAMINATION:  VITAL SIGNS:  Vital signs stable.  The last blood  pressure for [place] was 130/82.  GENERAL:  She is a well-developed, well-nourished female in no acute  distress.  HEENT:  Within normal limits.  NECK:  Supple without adenopathy or thyromegaly.  HEART:  Regular rate and rhythm with a 1/6 systolic ejection murmur without  radiation.  LUNGS:  Clear.  BREASTS:  Deferred.  ABDOMEN:  Soft and benign.  EXTREMITIES:  Neurologically grossly normal.  PELVIC:  Normal  external female genitalia.  Vagina and cervix clear.  The  uterus is enlarged to about 18 weeks in size.  The adnexa is clear.   ADMITTING DIAGNOSES:  1. Symptomatic uterine fibroids.  2. Significant coronary artery disease - cleared for surgery.  3. Status post myocardial infarction.   PLAN:  Total abdominal hysterectomy.   The risks and benefits once again reiterated to this patient.  This is  regarding the removal of her uterus.  The risk of bleeding, infection, risk  of injury to surrounding organs, as well as alternative treatments discussed  with her.  Tracie Harrier, M.D.    REG/MEDQ  D:  05/07/2003  T:  05/07/2003  Job:  [id number]

## 2010-05-21 NOTE — H&P (Signed)
NAMEJENNILEE, [name] NO.:  [id number]   MEDICAL RECORD NO.:  [id number]          PATIENT TYPE:  EMS   LOCATION:  ED                           FACILITY:  [facility]   PHYSICIAN:  Marlan Palau, M.D.  DATE OF BIRTH:  10-Aug-1960   DATE OF ADMISSION:  01/08/2004  DATE OF DISCHARGE:                                HISTORY & PHYSICAL   HISTORY OF PRESENT ILLNESS:  The patient is a 50 year old right-handed black  female born on 04/01/1960, with a history of hypertension, coronary  artery disease, history of MI in the past.  The patient presents with  history of left face and arm weakness, numbness, gait disturbance, and falls  that began around 9 p.m. today.  The patient, however, claims that she was  not feeling well most of the day and had a headache that she noted began  around 4 p.m. today, got worse around 9 p.m.  The patient was brought to the  emergency room for an evaluation around 11 p.m. or a little bit after  tonight.  CT scan of the head shows evidence of an acute/subacute right  frontal infarct.  Neurology is called for further evaluation.   PAST MEDICAL HISTORY:  Significant for:  1.  Right middle cerebral artery distribution cerebrovascular infarction,      new onset.  2.  Hypertension.  3.  Obesity.  4.  Coronary artery disease, status post stent placement x2 in the past for      history of MI.  5.  History of hypercholesterolemia.  6.  History of hysterectomy for uterine fibroids.  7.  History of right rotator cuff placement.   MEDICATIONS:  1.  Hydrochlorothiazide 25 mg a day.  2.  Toprol XL 200 mg daily.  3.  Lipitor 20 mg daily.   ALLERGIES:  No known drug allergies.   HABITS:  Does not smoke or drink.   SOCIAL HISTORY:  The patient is single, lives in the [place] area, and  has three children who are alive and well.  Works as a Medical sales representative.   FAMILY HISTORY:  Mother is alive.  Father is alive.  Both have  hypertension.  The patient has three brothers, two with hypertension.  The patient has no  family history of diabetes or cancer.   REVIEW OF SYSTEMS:  Notable for no fevers or chills.  The patient denies  shortness of breath and chest pain.  Does note some neck pain on the right.  Does note a headache.  The patient denies any abdominal pain, nausea or  vomiting.  Did note some urgency of the bowels today.  Did have some gait  disturbance and a fall.  No blackouts.   PHYSICAL EXAMINATION:  VITAL SIGNS:  Blood pressure 164/102, heart rate 100,  respiratory rate 20, temperature 99.4.  GENERAL:  This patient is a moderately to markedly obese black female who is  slightly sleepy at the time of examination.  HEENT:  Head is atraumatic.  Eyes; pupils equal, round, and reactive to  light.  Discs are flat  bilaterally.  NECK:  Supple, no carotid bruits noted.  LUNGS:  Clear.  HEART:  Regular rate and rhythm with no obvious murmurs or rubs noted.  EXTREMITIES:  Without significant edema.  ABDOMEN:  Positive bowel sounds.  No organomegaly or tenderness noted.  NEUROLOGY:  Cranial nerves as above.  The patient has very definite left  facial droop.  Has good symmetry of pinprick sensation, however, has mild  dysarthria.  Extraocular movements are full.  Visual fields are full to  double simultaneous stimulation.  Speech is not aphasic.  Motor testing  reveals definite weakness of the left upper extremity, some drift is  prominent noted.  Mild drift over the left lower extremity.  The patient  does have antigravity movement of both the left arm and the left leg.  Has  more weakness with the left hand than the upper arm on the left.  Good  strength is noted on the right arm and right leg.  The patient has good  symmetry to pinprick, soft touch, vibratory sensation throughout.  Soft  touch, however, sensation is depressed on the left as compared to the right  on the arm, not as much on the leg.  Deep  tendon reflexes are symmetric and  normal.  Toes are neutral bilaterally.  The patient has significant ataxia  with finger-to-nose-to-finger.  Fairly good heel-to-shin bilaterally.  Good  finger-to-nose-to-finger on the right arm.  The patient was not ambulated.   LABORATORY DATA:  White count 8.7, hemoglobin 13.3, hematocrit 38.9, MCV  81.7, platelets 291, INR 0.9.  Sodium 134, potassium 3.4, chloride 104, CO2  23, glucose 139, BUN 14, creatinine 0.9, total bilirubin 0.7, alkaline  phosphatase 70, SGOT 25, SGPT 28, total protein 6.8, albumin 3.3, calcium  8.7.  Specific gravity 1.015, pH 6.0, otherwise urinalysis unremarkable.   Chest x-ray has been done and appears to be relatively unremarkable. Final  report is pending.  EKG is pending.  CT is as above.   IMPRESSION:  1.  New onset of a right middle cerebral artery distribution stroke.  2.  Hypertension.  3.  Hypercholesterolemia.  4.  History of coronary artery disease.   This patient has essentially a metabolic syndrome and has multiple risk  factors for cerebrovascular disease.  The patient was not on aspirin prior  to this admission.  The patient claims the onset of symptoms within a couple  of hours of coming to the emergency room.  However, the CT scan of the head  shows evidence of demarcation of a right frontal cerebrovascular infarction  suggesting that the initiation of the stroke was many hours prior to the  onset of clinical symptoms.  The patient likely started her stroke much  earlier in the day, but extended the stroke around 9 p.m. tonight.  For this  reason, the patient is not a candidate for TPA or for SAINT II trial.  The  patient, however, does have significant deficits at this point and will  require admission.   PLAN:  1.  Admit to [facility].  2.  IV heparin.  3.  Blood pressure control.  4.  Physical and occupational and speech therapy, rehab. 5.  Two-dimensional echocardiogram.  6.  MRI of  the brain.  7.  MR angiogram.  8.  Will check cardiac enzymes as well.  Follow the patient's clinical      course while in house.       CKW/MEDQ  D:  01/09/2004  T:  01/09/2004  Job:  [id number]   cc:   Maryla Morrow. Modesto Charon, M.D.  [address]  [place]  [place] [address]  Fax: [phone]   Charlies Constable, M.D. [facility]

## 2011-02-28 IMAGING — CT CT ANGIO CHEST
2 of 6 series · 19 of 36 positions shown · IV contrast (APPLIED)
Comparison: 01/15/2004 CT and chest MRI 05/18/2006

CLINICAL DATA: Mild shortness of breath.  Dry cough.  Anxiety.
Onset of symptoms 2-3 days ago.  Elevated D-dimer.  History of
myocardial infarction, CAD, hypertension, stroke, stent placement.
Prior hysterectomy.

CT ANGIOGRAPHY CHEST WITH CONTRAST
TECHNIQUE: Multidetector CT imaging of the chest was performed
using the standard protocol during bolus administration of
intravenous contrast.  Multiplanar CT image reconstructions
including MIPs were obtained to evaluate the vascular anatomy.
Contrast:  100 ml Qmnipaque-ZAA

[Series 8: pulm embolism 1.0 b25f thins · axial · 0.57mm/px · z∈[-358,-165]mm · 18 of 215 slices shown]
[im 11/215  lung]
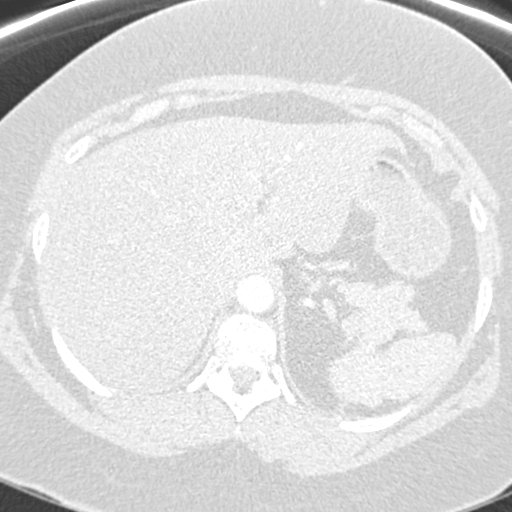
[im 22/215  mediastinal]
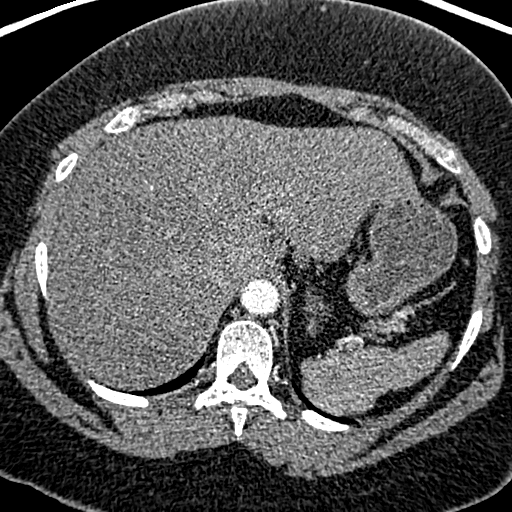
[im 33/215  lung]
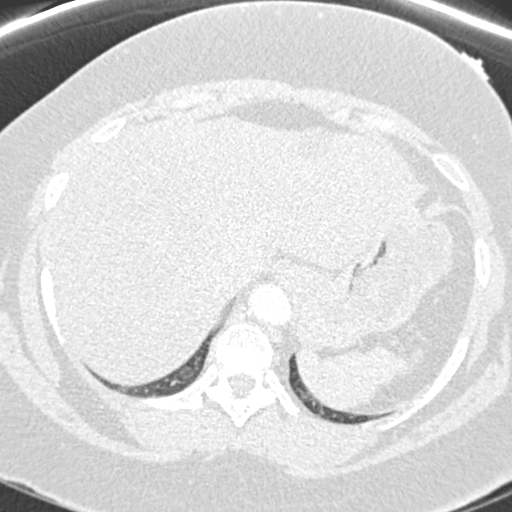
[im 43/215  mediastinal]
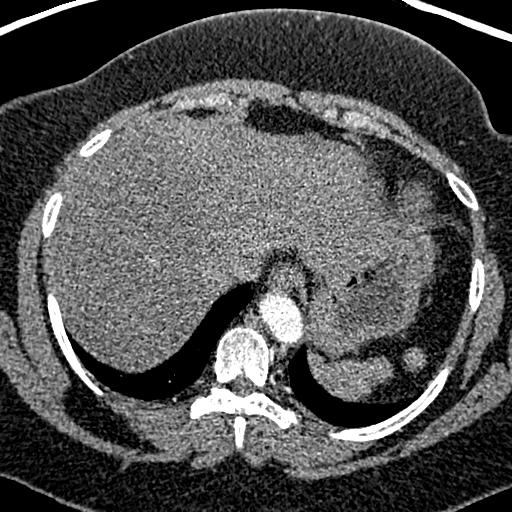
[im 54/215  lung]
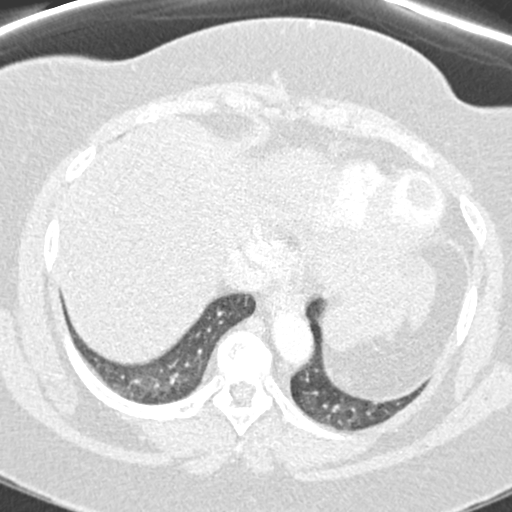
[im 65/215  mediastinal]
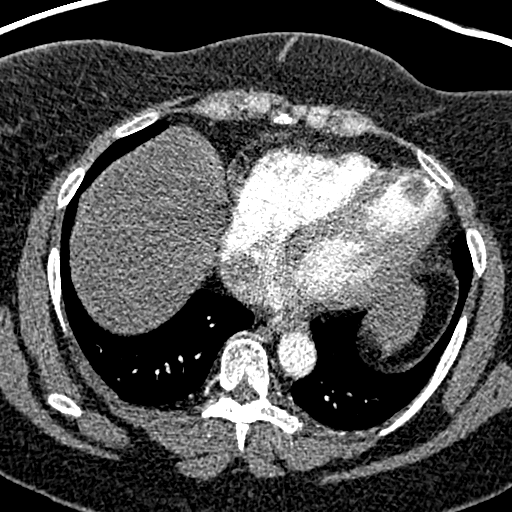
[im 75/215  lung]
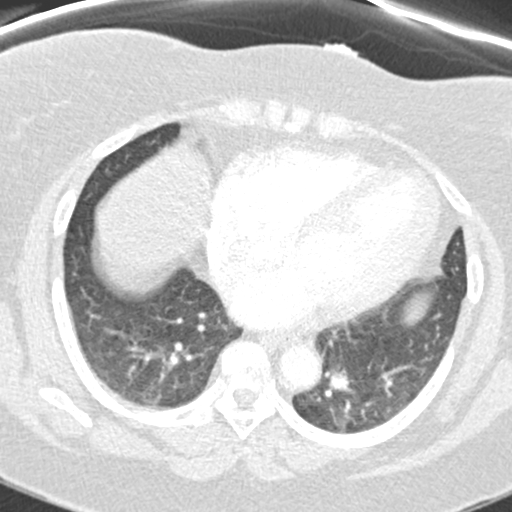
[im 86/215  mediastinal]
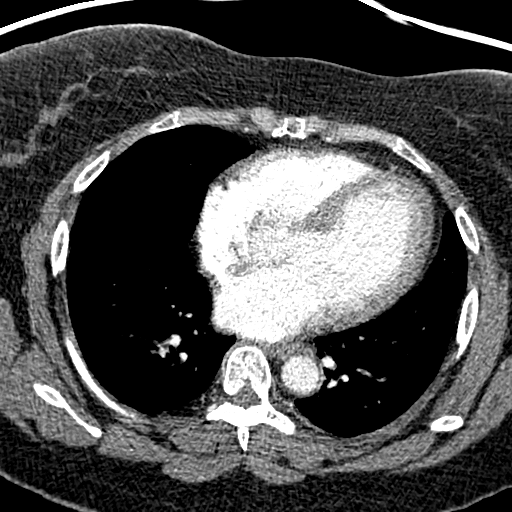
[im 97/215  lung]
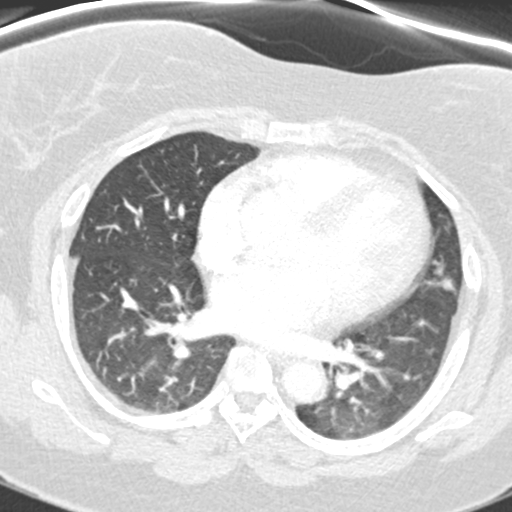
[im 118/215  mediastinal]
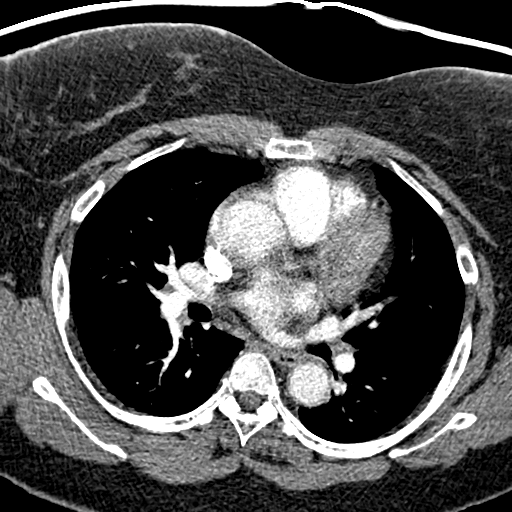
[im 129/215  lung]
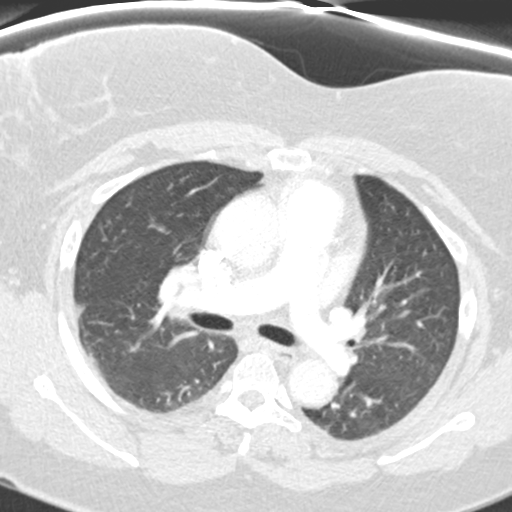
[im 140/215  mediastinal]
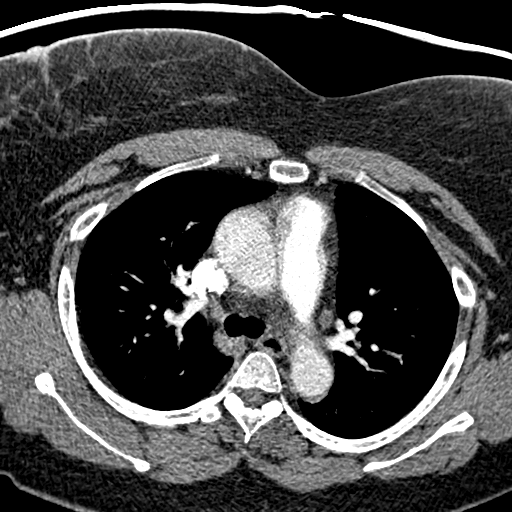
[im 150/215  lung]
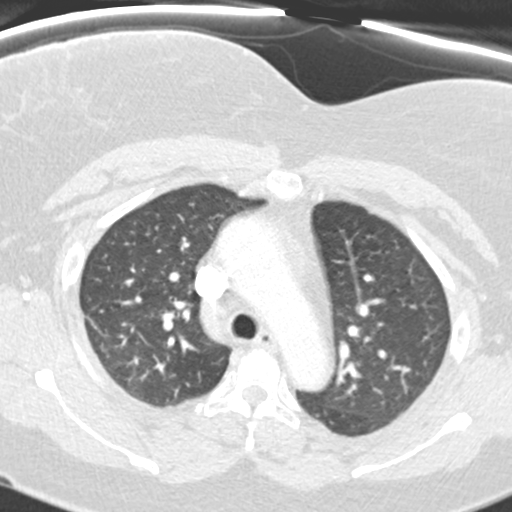
[im 161/215  mediastinal]
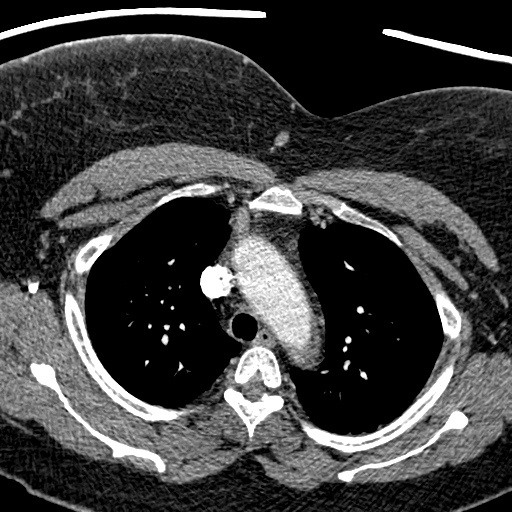
[im 172/215  lung]
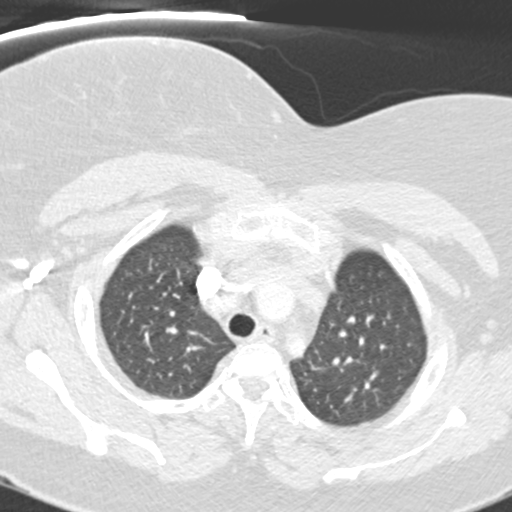
[im 182/215  mediastinal]
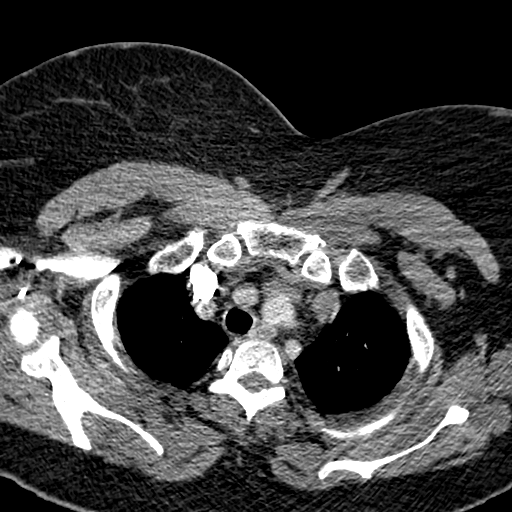
[im 193/215  lung]
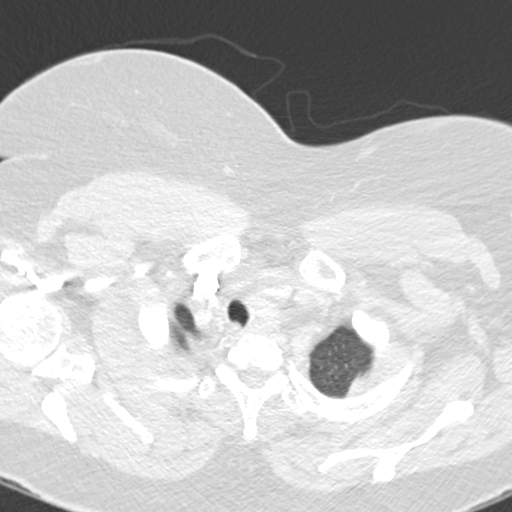
[im 204/215  mediastinal]
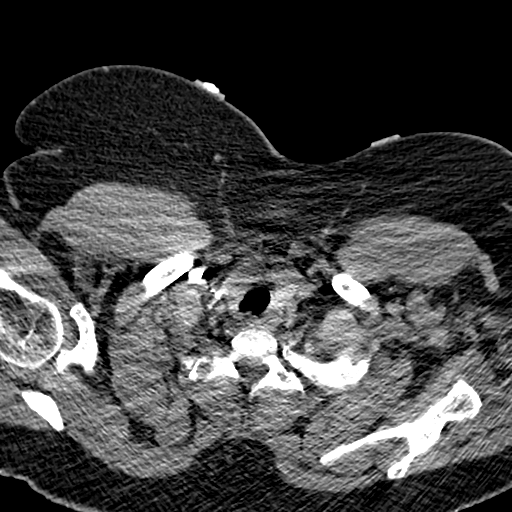

[Series 602: cor mpr · coronal · 0.57mm/px · 1 of 110 slices shown]
[im 55/110  mediastinal]
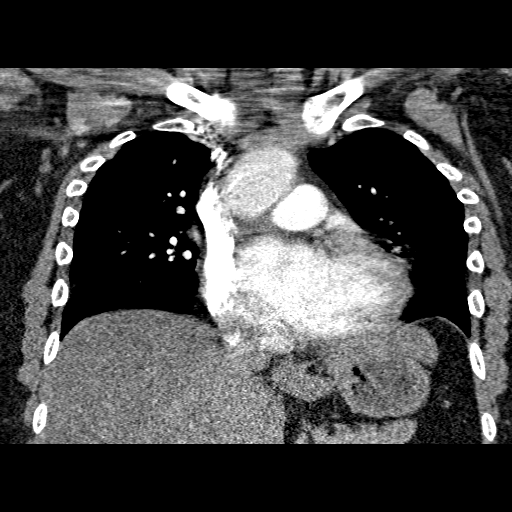

[19 of 36 positions shown; findings below may reference images not displayed]

FINDINGS: There is a soft tissue density mass within the apex of
the left ventricle.  This measures 1.6 x 2.0 cm and 33 HU in
density.  The wall of the left ventricle appears thinned in this
region. Within this portion of the ventricular wall, there is
calcification.  Findings are consistent with a ventricular
pseudoaneurysm with adjacent thrombus.  When compared to previous
MRI, the mass shows no significant change.  The patient has a left
anterior descending coronary stent.

The pulmonary arteries are well opacified.  There is no evidence
for acute pulmonary embolus.

There is no mediastinal, hilar, or axillary adenopathy.

Images of the upper abdomen show a low attenuation lesions within
the liver, stable in appearance and measuring 1.6 and 2.3 cm in
diameter.

Review of the MIP images confirms the above findings.
IMPRESSION: 1.  Left ventricular apical thrombus measuring 1.6 x 2.0 cm, stable
when compared to the previous MRI exam.  This is in the region of
thin myocardium, consistent with pseudoaneurysm.

2.  Technically adequate exam showing no evidence for acute
pulmonary embolus.

## 2011-06-23 ENCOUNTER — Encounter: Payer: Self-pay | Admitting: Internal Medicine

## 2011-07-14 ENCOUNTER — Encounter: Payer: Self-pay | Admitting: Internal Medicine

## 2011-07-15 ENCOUNTER — Encounter: Payer: Self-pay | Admitting: Internal Medicine

## 2011-07-15 ENCOUNTER — Ambulatory Visit (INDEPENDENT_AMBULATORY_CARE_PROVIDER_SITE_OTHER): Payer: Medicare Other | Admitting: Internal Medicine

## 2011-07-15 VITALS — BP 106/78 | HR 76 | Ht 63.0 in | Wt 228.5 lb

## 2011-07-15 DIAGNOSIS — E785 Hyperlipidemia, unspecified: Secondary | ICD-10-CM | POA: Insufficient documentation

## 2011-07-15 DIAGNOSIS — Z8673 Personal history of transient ischemic attack (TIA), and cerebral infarction without residual deficits: Secondary | ICD-10-CM | POA: Insufficient documentation

## 2011-07-15 DIAGNOSIS — I10 Essential (primary) hypertension: Secondary | ICD-10-CM | POA: Insufficient documentation

## 2011-07-15 DIAGNOSIS — Z1211 Encounter for screening for malignant neoplasm of colon: Secondary | ICD-10-CM

## 2011-07-15 DIAGNOSIS — I2510 Atherosclerotic heart disease of native coronary artery without angina pectoris: Secondary | ICD-10-CM | POA: Insufficient documentation

## 2011-07-15 MED ORDER — PEG-KCL-NACL-NASULF-NA ASC-C 100 G PO SOLR: 1.0000 | Freq: Once | ORAL | Status: DC

## 2011-07-15 NOTE — Patient Instructions (Addendum)
You have been scheduled for a colonoscopy with propofol. Please follow written instructions given to you at your visit today.  Please pick up your prep kit at the pharmacy within the next 1-3 days. If you use inhalers (even only as needed), please bring them with you on the day of your procedure.  

## 2011-07-15 NOTE — Progress Notes (Signed)
Patient ID: [name], female   DOB: 1960-03-02, 51 y.o.   MRN: [id number]  SUBJECTIVE: HPI [name] is a 51 year old female with a past medical history of hypertension, CAD status post PCI about 10 years ago, remote stroke, and hyperlipidemia who seen in consultation at the request of Dr. Shifflet Cullens for evaluation of screening colonoscopy. She is currently having no complaints. Her bowel habits have been normal without blood or melena. No diarrhea or constipation. No abdominal pain. No nausea or vomiting. Stable weight. Good appetite. She is on Plavix and has been maintained on this for years.  Review of Systems  As per history of present illness, otherwise negative   Past Medical History  Diagnosis Date  . History of blood clots   . Heart disease   . Hypertension   . Memory problem   . Stroke   . HLD (hyperlipidemia)     Current Outpatient Prescriptions  Medication Sig Dispense Refill  . amLODipine (NORVASC) 5 MG tablet Take 5 mg by mouth daily.      . clopidogrel (PLAVIX) 75 MG tablet Take 75 mg by mouth daily.      . hydrochlorothiazide (HYDRODIURIL) 25 MG tablet Take 25 mg by mouth daily.      . hydrocortisone 2.5 % cream Apply topically 2 (two) times daily.      Marland Kitchen lisinopril-hydrochlorothiazide (PRINZIDE,ZESTORETIC) 20-25 MG per tablet Take 1 tablet by mouth daily.      Marland Kitchen lovastatin (MEVACOR) 20 MG tablet Take 20 mg by mouth at bedtime.      . metoprolol succinate (TOPROL-XL) 50 MG 24 hr tablet Take 50 mg by mouth daily. Take with or immediately following a meal.      . Omega-3 Fatty Acids (THERAGRAN-M FISH OIL CONC PO) Take by mouth. Specific dose unknown      . peg 3350 powder (MOVIPREP) 100 G SOLR Take 1 kit (100 g total) by mouth once.  1 kit  0    No Known Allergies  Family History  Problem Relation Age of Onset  . Hypertension Mother   . Hypertension Father     History  Substance Use Topics  . Smoking status: Never Smoker   . Smokeless tobacco: Never Used  .  Alcohol Use: No    OBJECTIVE: BP 106/78  Pulse 76  Ht 5\' 3"  (1.6 m)  Wt 228 lb 8 oz (103.647 kg)  BMI 40.48 kg/m2 Constitutional: Well-developed and well-nourished. No distress. HEENT: Normocephalic and atraumatic. Oropharynx is clear and moist. No oropharyngeal exudate. Conjunctivae are normal. No scleral icterus. Neck: Neck supple. Trachea midline. Cardiovascular: Normal rate, regular rhythm and intact distal pulses. No M/R/G Pulmonary/chest: Effort normal and breath sounds normal. No wheezing, rales or rhonchi. Abdominal: Soft, nontender, obese, nondistended. Bowel sounds active throughout. There are no masses palpable. No hepatosplenomegaly. Extremities: no clubbing, cyanosis, or edema Lymphadenopathy: No cervical adenopathy noted. Neurological: Alert and oriented to person place and time. Skin: Skin is warm and dry. No rashes noted. Psychiatric: Normal mood and affect. Behavior is normal.  ASSESSMENT AND PLAN: 51 year old female with a past medical history of hypertension, CAD status post PCI about 10 years ago, remote stroke, and hyperlipidemia who seen in consultation at the request of Dr. Lemma Cullens for evaluation of screening colonoscopy.  1. CRC screening -- the patient is average risk for colorectal cancer screening standpoint, she is 51 years old, and has yet to have a screening colonoscopy. She is willing to proceed after we discussed the risks and  benefits, and I feel this test is appropriate for her. She will he told her Plavix for 5 days before the procedure, and possibly several days afterward depending on the results of colonoscopy. We will seek permission from Dr. Ferrari Cullens to hold this medication. Further recommendations can be made after procedure.

## 2011-07-19 ENCOUNTER — Telehealth: Payer: Self-pay | Admitting: Gastroenterology

## 2011-07-19 ENCOUNTER — Other Ambulatory Visit: Payer: Self-pay | Admitting: Gastroenterology

## 2011-07-19 NOTE — Telephone Encounter (Signed)
07/19/2011    RE: [name] DOB: 10-Sep-1960 MRN: [id number]   Dear Elpidio Anis PA,    We have scheduled the above patient for an endoscopic procedure. Our records show that she is on anticoagulation therapy.   Please advise as to how long the patient may come off her therapy of Plavix prior to the procedure, which is scheduled for 08/23/2011.  Please fax back/ or route the completed form to [facility] at [phone].   Sincerely,  Dr. Erick Blinks

## 2011-08-05 ENCOUNTER — Other Ambulatory Visit: Payer: Self-pay | Admitting: Physician Assistant

## 2011-08-05 MED ORDER — AMLODIPINE BESYLATE 5 MG PO TABS: 5.0000 mg | ORAL_TABLET | Freq: Every day | ORAL | Status: DC

## 2011-08-15 ENCOUNTER — Telehealth: Payer: Self-pay | Admitting: Gastroenterology

## 2011-08-15 NOTE — Telephone Encounter (Signed)
LM for Elpidio Anis asking about pt's Plavix and how long should she come off of it prior to colonoscopy. Informed nurse I have faxed over 2 letters over the past month with no response. Need reply ASAP.

## 2011-08-16 ENCOUNTER — Telehealth: Payer: Self-pay | Admitting: Gastroenterology

## 2011-08-16 ENCOUNTER — Telehealth: Payer: Self-pay

## 2011-08-16 NOTE — Telephone Encounter (Signed)
Jamie from [employer] called to state that Pt may go off her Plavix the day prior to her procedure and resume it the day after.

## 2011-08-16 NOTE — Telephone Encounter (Signed)
LM for pt to call me back about holding her plavix prior to procedure.

## 2011-08-18 ENCOUNTER — Telehealth: Payer: Self-pay | Admitting: Internal Medicine

## 2011-08-23 ENCOUNTER — Ambulatory Visit (AMBULATORY_SURGERY_CENTER): Payer: Medicare Other | Admitting: Internal Medicine

## 2011-08-23 ENCOUNTER — Encounter: Payer: Self-pay | Admitting: Internal Medicine

## 2011-08-23 VITALS — BP 134/79 | HR 74 | Temp 98.2°F | Resp 18 | Ht 63.0 in | Wt 228.0 lb

## 2011-08-23 DIAGNOSIS — Z1211 Encounter for screening for malignant neoplasm of colon: Secondary | ICD-10-CM

## 2011-08-23 MED ORDER — SODIUM CHLORIDE 0.9 % IV SOLN: 500.0000 mL | INTRAVENOUS | Status: DC

## 2011-08-23 NOTE — Patient Instructions (Addendum)
Discharge instructions given with verbal understanding. Normal exam. Resume previous medications. YOU HAD AN ENDOSCOPIC PROCEDURE TODAY AT THE [facility]: Refer to the procedure report that was given to you for any specific questions about what was found during the examination.  If the procedure report does not answer your questions, please call your gastroenterologist to clarify.  If you requested that your care partner not be given the details of your procedure findings, then the procedure report has been included in a sealed envelope for you to review at your convenience later.  YOU SHOULD EXPECT: Some feelings of bloating in the abdomen. Passage of more gas than usual.  Walking can help get rid of the air that was put into your GI tract during the procedure and reduce the bloating. If you had a lower endoscopy (such as a colonoscopy or flexible sigmoidoscopy) you may notice spotting of blood in your stool or on the toilet paper. If you underwent a bowel prep for your procedure, then you may not have a normal bowel movement for a few days.  DIET: Your first meal following the procedure should be a light meal and then it is ok to progress to your normal diet.  A half-sandwich or bowl of soup is an example of a good first meal.  Heavy or fried foods are harder to digest and may make you feel nauseous or bloated.  Likewise meals heavy in dairy and vegetables can cause extra gas to form and this can also increase the bloating.  Drink plenty of fluids but you should avoid alcoholic beverages for 24 hours.  ACTIVITY: Your care partner should take you home directly after the procedure.  You should plan to take it easy, moving slowly for the rest of the day.  You can resume normal activity the day after the procedure however you should NOT DRIVE or use heavy machinery for 24 hours (because of the sedation medicines used during the test).    SYMPTOMS TO REPORT IMMEDIATELY: A gastroenterologist  can be reached at any hour.  During normal business hours, 8:30 AM to 5:00 PM Monday through Friday, call [phone].  After hours and on weekends, please call the GI answering service at [phone] who will take a message and have the physician on call contact you.   Following lower endoscopy (colonoscopy or flexible sigmoidoscopy):  Excessive amounts of blood in the stool  Significant tenderness or worsening of abdominal pains  Swelling of the abdomen that is new, acute  Fever of 100F or higher  FOLLOW UP: If any biopsies were taken you will be contacted by phone or by letter within the next 1-3 weeks.  Call your gastroenterologist if you have not heard about the biopsies in 3 weeks.  Our staff will call the home number listed on your records the next business day following your procedure to check on you and address any questions or concerns that you may have at that time regarding the information given to you following your procedure. This is a courtesy call and so if there is no answer at the home number and we have not heard from you through the emergency physician on call, we will assume that you have returned to your regular daily activities without incident.  SIGNATURES/CONFIDENTIALITY: You and/or your care partner have signed paperwork which will be entered into your electronic medical record.  These signatures attest to the fact that that the information above on your After Visit Summary has been reviewed   and is understood.  Full responsibility of the confidentiality of this discharge information lies with you and/or your care-partner. 

## 2011-08-23 NOTE — Progress Notes (Signed)
Patient did not experience any of the following events: a burn prior to discharge; a fall within the facility; wrong site/side/patient/procedure/implant event; or a hospital transfer or hospital admission upon discharge from the facility. (G8907) Patient did not have preoperative order for IV antibiotic SSI prophylaxis. (G8918)  

## 2011-08-23 NOTE — Op Note (Signed)
[facility] [address] [place], [address]   COLONOSCOPY PROCEDURE REPORT  PATIENT: [name], [name]  MR#: [id number] BIRTHDATE: 1960/03/13 , 50  yrs. old GENDER: Female ENDOSCOPIST: Beverley Fiedler, MD REFERRED BY: Elpidio Anis, M.D. PROCEDURE DATE:  08/23/2011 PROCEDURE:   Colonoscopy, screening ASA CLASS:   Class III INDICATIONS:average risk screening. MEDICATIONS: propofol (Diprivan) 240 mg IV  DESCRIPTION OF PROCEDURE:   After the risks benefits and alternatives of the procedure were thoroughly explained, informed consent was obtained.  A digital rectal exam revealed no rectal mass.   The LB [id number]-[id number]  endoscope was introduced through the anus and advanced to the cecum, which was identified by both the appendix and ileocecal valve. No adverse events experienced. The quality of the prep was good, using MoviPrep  The instrument was then slowly withdrawn as the colon was fully examined.   COLON FINDINGS: A normal appearing cecum, ileocecal valve, and appendiceal orifice were identified.  The ascending, hepatic flexure, transverse, splenic flexure, descending, sigmoid colon and rectum appeared unremarkable.  No polyps or cancers were seen. Retroflexed views revealed internal hemorrhoid.          The scope was withdrawn and the procedure completed.  COMPLICATIONS: There were no complications.  ENDOSCOPIC IMPRESSION:  Normal colon  RECOMMENDATIONS: You should continue to follow colorectal cancer screening guidelines for "routine risk" patients with a repeat colonoscopy in 10 years. There is no need for FOBT (stool) testing for at least 5 years.  eSigned:  Beverley Fiedler, MD 08/23/2011 3:14 PM   cc: The Patient Elpidio Anis MD

## 2011-08-24 ENCOUNTER — Telehealth: Payer: Self-pay | Admitting: *Deleted

## 2011-08-24 NOTE — Telephone Encounter (Signed)
  Follow up Call-  Call back number 08/23/2011  Post procedure Call Back phone  # [phone]  Permission to leave phone message (No Data)  comments no voicemail     Patient questions:  Do you have a fever, pain , or abdominal swelling? no Pain Score  0 *  Have you tolerated food without any problems? yes  Have you been able to return to your normal activities? yes  Do you have any questions about your discharge instructions: Diet   no Medications  no Follow up visit  no  Do you have questions or concerns about your Care? no  Actions: * If pain score is 4 or above: No action needed, pain <4.

## 2012-05-26 ENCOUNTER — Other Ambulatory Visit: Payer: Self-pay

## 2012-05-26 ENCOUNTER — Emergency Department (HOSPITAL_COMMUNITY): Payer: Medicare PPO

## 2012-05-26 ENCOUNTER — Encounter (HOSPITAL_COMMUNITY): Payer: Self-pay | Admitting: Emergency Medicine

## 2012-05-26 ENCOUNTER — Emergency Department (HOSPITAL_COMMUNITY)
Admission: EM | Admit: 2012-05-26 | Discharge: 2012-05-26 | Disposition: A | Discharge status: Home / Self Care | Payer: Medicare PPO | Attending: Emergency Medicine | Admitting: Emergency Medicine

## 2012-05-26 DIAGNOSIS — Z9861 Coronary angioplasty status: Secondary | ICD-10-CM | POA: Insufficient documentation

## 2012-05-26 DIAGNOSIS — I252 Old myocardial infarction: Secondary | ICD-10-CM | POA: Insufficient documentation

## 2012-05-26 DIAGNOSIS — E785 Hyperlipidemia, unspecified: Secondary | ICD-10-CM | POA: Insufficient documentation

## 2012-05-26 DIAGNOSIS — Z7902 Long term (current) use of antithrombotics/antiplatelets: Secondary | ICD-10-CM | POA: Insufficient documentation

## 2012-05-26 DIAGNOSIS — Z8679 Personal history of other diseases of the circulatory system: Secondary | ICD-10-CM | POA: Insufficient documentation

## 2012-05-26 DIAGNOSIS — R079 Chest pain, unspecified: Secondary | ICD-10-CM | POA: Insufficient documentation

## 2012-05-26 DIAGNOSIS — Z79899 Other long term (current) drug therapy: Secondary | ICD-10-CM | POA: Insufficient documentation

## 2012-05-26 DIAGNOSIS — R413 Other amnesia: Secondary | ICD-10-CM | POA: Insufficient documentation

## 2012-05-26 DIAGNOSIS — Z8673 Personal history of transient ischemic attack (TIA), and cerebral infarction without residual deficits: Secondary | ICD-10-CM | POA: Insufficient documentation

## 2012-05-26 DIAGNOSIS — I10 Essential (primary) hypertension: Secondary | ICD-10-CM | POA: Insufficient documentation

## 2012-05-26 LAB — COMPREHENSIVE METABOLIC PANEL
ALT: 25 U/L (ref 0–35)
Alkaline Phosphatase: 79 U/L (ref 39–117)
BUN: 15 mg/dL (ref 6–23)
CO2: 24 mEq/L (ref 19–32)
Chloride: 103 mEq/L (ref 96–112)
GFR calc Af Amer: 68 mL/min — ABNORMAL LOW (ref >90)
Glucose, Bld: 116 mg/dL — ABNORMAL HIGH (ref 70–99)
Potassium: 3.4 mEq/L — ABNORMAL LOW (ref 3.5–5.1)
Sodium: 142 mEq/L (ref 135–145)
Total Bilirubin: 0.4 mg/dL (ref 0.3–1.2)
Total Protein: 7.1 g/dL (ref 6.0–8.3)

## 2012-05-26 LAB — CBC
HCT: 40.4 % (ref 36.0–46.0)
Hemoglobin: 13.6 g/dL (ref 12.0–15.0)
MCHC: 33.7 g/dL (ref 30.0–36.0)
RBC: 4.75 MIL/uL (ref 3.87–5.11)
WBC: 10.3 10*3/uL (ref 4.0–10.5)

## 2012-05-26 LAB — MAGNESIUM: Magnesium: 2.2 mg/dL (ref 1.5–2.5)

## 2012-05-26 LAB — POCT I-STAT TROPONIN I: Troponin i, poc: 0.02 ng/mL (ref 0.00–0.08)

## 2012-05-26 LAB — APTT: aPTT: 30 seconds (ref 24–37)

## 2012-05-26 LAB — POCT I-STAT, CHEM 8
BUN: 15 mg/dL (ref 6–23)
Calcium, Ion: 1.17 mmol/L (ref 1.12–1.23)
Chloride: 107 mEq/L (ref 96–112)
Creatinine, Ser: 1.2 mg/dL — ABNORMAL HIGH (ref 0.50–1.10)

## 2012-05-26 LAB — PRO B NATRIURETIC PEPTIDE: Pro B Natriuretic peptide (BNP): 103.9 pg/mL (ref 0–125)

## 2012-05-26 MED ORDER — GI COCKTAIL ~~LOC~~
30.0000 mL | Freq: Once | ORAL | Status: AC
  Administered 2012-05-26: 30 mL via ORAL
  Filled 2012-05-26: qty 30

## 2012-05-26 MED ORDER — OMEPRAZOLE 20 MG PO CPDR: 20.0000 mg | DELAYED_RELEASE_CAPSULE | Freq: Every day | ORAL | Status: DC

## 2012-05-26 MED ORDER — RANITIDINE HCL 150 MG PO CAPS: 150.0000 mg | ORAL_CAPSULE | Freq: Every day | ORAL | Status: DC

## 2012-05-26 NOTE — ED Notes (Signed)
Pt returned from xray

## 2012-05-26 NOTE — ED Notes (Signed)
Notified by lab of need to obtain second urine sample

## 2012-05-26 NOTE — ED Notes (Signed)
Pt developed CP tonight that woke her up from sleep. Pt describes pain as central chest tightness and 4/10 non-radiating. Pt denies N/V. Pt had SOB on scene, but has since resolved. Pt has hx of MI in 2007 with stent placement, and pt states that the pain she had tonight is similar to the pain she had with her previous MI. Pt also has hx of stroke, but was unable to recall date. Pt has left sided deficits from previous stroke. Pt takes Plavix daily. EMS gave 324 ASA, but no nitro. VSS.

## 2012-05-26 NOTE — ED Provider Notes (Signed)
History     CSN: [id number]  Arrival date & time 05/26/12  0114   First MD Initiated Contact with Patient 05/26/12 0120      Chief Complaint  Patient presents with  . Chest Pain     The history is provided by the patient.   patient reports developing some midsternal chest discomfort that her awoke her from sleep.  She had no radiation of her pain.  She stated felt like a tightness.  No nausea or vomiting.  No shortness of breath.  She does have a history of MI with prior stent.  She continues to take her Plavix as prescribed.  She does not follow with her cardiologist for some time.  She states that when she had her MI the pain radiated more tender her shoulders and therefore this feels slightly different than her prior pain because there is no radiation with this symptoms.  She does have a history of stroke as well.  Denies fevers or chills.  No cough or shortness of breath.  She reports her pain in her chest is significantly improving.  EMS gave an aspirin.  No nitroglycerin was given.  The patient has no nitroglycerin at home.  Past Medical History  Diagnosis Date  . History of blood clots   . Heart disease   . Hypertension   . Memory problem   . Stroke   . HLD (hyperlipidemia)     Past Surgical History  Procedure Laterality Date  . Abdominal hysterectomy  1998  . Percutaneous transluminal balloon angioplasty      W/ stent placement x2  . Foot surgery      right  . Toe surgery      left    Family History  Problem Relation Age of Onset  . Hypertension Mother   . Hypertension Father     History  Substance Use Topics  . Smoking status: Never Smoker   . Smokeless tobacco: Never Used  . Alcohol Use: No    OB History   Grav Para Term Preterm Abortions TAB SAB Ect Mult Living                  Review of Systems  All other systems reviewed and are negative.    Allergies  Review of patient's allergies indicates no known allergies.  Home Medications    Current Outpatient Rx  Name  Route  Sig  Dispense  Refill  . amLODipine (NORVASC) 5 MG tablet   Oral   Take 1 tablet (5 mg total) by mouth daily. Needs office visit!!   30 tablet   0   . clopidogrel (PLAVIX) 75 MG tablet   Oral   Take 75 mg by mouth daily.         . hydrocortisone 2.5 % cream   Topical   Apply topically 2 (two) times daily.         Marland Kitchen lisinopril-hydrochlorothiazide (PRINZIDE,ZESTORETIC) 20-25 MG per tablet   Oral   Take 1 tablet by mouth daily.         Marland Kitchen lovastatin (MEVACOR) 40 MG tablet   Oral   Take 40 mg by mouth daily.         . metoprolol succinate (TOPROL-XL) 50 MG 24 hr tablet   Oral   Take 50 mg by mouth 2 (two) times daily.          . Omega-3 Fatty Acids (THERAGRAN-M FISH OIL CONC PO)   Oral   Take by  mouth. Specific dose unknown           BP 107/64  Pulse 90  Temp(Src) 98.7 F (37.1 C) (Oral)  Resp 21  SpO2 95%  Physical Exam  Nursing note and vitals reviewed. Constitutional: She is oriented to person, place, and time. She appears well-developed and well-nourished. No distress.  HENT:  Head: Normocephalic and atraumatic.  Eyes: EOM are normal.  Neck: Normal range of motion.  Cardiovascular: Normal rate, regular rhythm and normal heart sounds.   Pulmonary/Chest: Effort normal and breath sounds normal.  Abdominal: Soft. She exhibits no distension. There is no tenderness.  Musculoskeletal: Normal range of motion.  Neurological: She is alert and oriented to person, place, and time.  Skin: Skin is warm and dry.  Psychiatric: She has a normal mood and affect. Judgment normal.    ED Course  Procedures (including critical care time)   Date: 05/26/2012  Rate: 107  Rhythm: Sinus tachycardia  QRS Axis: normal  Intervals: normal  ST/T Wave abnormalities: normal  Conduction Disutrbances: none  Narrative Interpretation:   Old EKG Reviewed: No significant changes noted     Labs Reviewed  COMPREHENSIVE METABOLIC PANEL  - Abnormal; Notable for the following:    Potassium 3.4 (*)    Glucose, Bld 116 (*)    Albumin 3.2 (*)    GFR calc non Af Amer 59 (*)    GFR calc Af Amer 68 (*)    All other components within normal limits  POCT I-STAT, CHEM 8 - Abnormal; Notable for the following:    Potassium 3.4 (*)    Creatinine, Ser 1.20 (*)    Glucose, Bld 112 (*)    All other components within normal limits  PRO B NATRIURETIC PEPTIDE  CBC  MAGNESIUM  PROTIME-INR  APTT  URINALYSIS, ROUTINE W REFLEX MICROSCOPIC  POCT I-STAT TROPONIN I  POCT I-STAT TROPONIN I   Dg Chest 2 View  05/26/2012   *RADIOLOGY REPORT*  Clinical Data: Chest pain and shortness of breath.  CHEST - 2 VIEW  Comparison: Chest radiograph and CTA of the chest performed 06/10/2009  Findings: The lungs are well-aerated.  Mild peribronchial thickening is noted.  There is no evidence of focal opacification, pleural effusion or pneumothorax.  The heart is normal in size; the mediastinal contour is within normal limits.  No acute osseous abnormalities are seen.  IMPRESSION: Mild peribronchial thickening noted; lungs otherwise clear.   Original Report Authenticated By: Tonia Ghent, M.D.   I personally reviewed the imaging tests through PACS system I reviewed available ER/hospitalization records through the EMR   1. Chest pain       MDM  Patient's pain improved with a GI cocktail.  Her pain was him as result of the time she came here.  Troponin x2 and EKG are normal.  I do not believe this is ACS.  This is no gastroesophageal reflux disease.  PCP followup.  Cardiology followup.  She understands return to the ER for new or worsening symptoms.        Lyanne Co, MD 05/26/12 [phone]

## 2012-05-26 NOTE — ED Notes (Signed)
Pt comfortable with d/c and f/u instructions. Prescriptions x1 

## 2012-05-26 NOTE — ED Notes (Signed)
Pt reported being SOB on scene, but states that it has resolved. Pt states she was very anxious and states that once her pain resolved, she became relaxed and her SOB resolved.

## 2013-04-01 ENCOUNTER — Telehealth: Payer: Self-pay | Admitting: Internal Medicine

## 2013-04-01 NOTE — Telephone Encounter (Signed)
[facility] 11 pages to Dr.Perry

## 2013-11-08 ENCOUNTER — Encounter: Payer: Self-pay | Admitting: Neurology

## 2013-11-08 ENCOUNTER — Ambulatory Visit (INDEPENDENT_AMBULATORY_CARE_PROVIDER_SITE_OTHER): Payer: Medicare PPO | Admitting: Neurology

## 2013-11-08 VITALS — BP 117/79 | HR 65 | Temp 97.1°F | Ht 64.0 in | Wt 231.0 lb

## 2013-11-08 DIAGNOSIS — G4733 Obstructive sleep apnea (adult) (pediatric): Secondary | ICD-10-CM

## 2013-11-08 DIAGNOSIS — G8114 Spastic hemiplegia affecting left nondominant side: Secondary | ICD-10-CM

## 2013-11-08 DIAGNOSIS — G4710 Hypersomnia, unspecified: Secondary | ICD-10-CM

## 2013-11-08 DIAGNOSIS — G8110 Spastic hemiplegia affecting unspecified side: Secondary | ICD-10-CM

## 2013-11-08 DIAGNOSIS — R351 Nocturia: Secondary | ICD-10-CM

## 2013-11-08 DIAGNOSIS — Z8673 Personal history of transient ischemic attack (TIA), and cerebral infarction without residual deficits: Secondary | ICD-10-CM

## 2013-11-08 DIAGNOSIS — R400 Somnolence: Secondary | ICD-10-CM

## 2013-11-08 DIAGNOSIS — E669 Obesity, unspecified: Secondary | ICD-10-CM

## 2013-11-08 NOTE — Progress Notes (Signed)
Subjective:    Patient ID: [name] is a 53 y.o. female.  HPI     Huston Foley, MD, PhD [facility] [address], [address] [address] [place], [place] [address]  Dear Boyd Kerbs,   I saw your patient, [name], upon your kind request in my neurologic clinic today for initial consultation of her sleep disorder, in particular, concern for underlying obstructive sleep apnea, in the context of a recently abnormal overnight pulse oximetry test. The patient is accompanied by her daughter today. As you know, [name] is a 53 year old right-handed woman with an underlying medical history of severe obesity, hyperlipidemia, hypertension, heart disease, status post stent placement, history of stroke, history of venous thrombosis, memory issues, who is reported to snore and have apneic pauses while asleep. She had an overnight pulse oximetry test on 10/14/2013 which I reviewed: Total valid test time was 5 hours and 2 minutes, average oxygen saturation was 90.3%. Lowest oxygen saturation was 80%, time below 88% saturation was 51 minutes.   Her typical bedtime is reported to be around 12 or 1 AM and usual wake time is around 6 AM. Sleep onset typically occurs within 1 hour. She reports feeling marginally rested upon awakening. She wakes up on an average 1 times in the middle of the night and has to go to the bathroom 1 times on a typical night. She denies morning headaches.  She reports excessive daytime somnolence (EDS) and Her Epworth Sleepiness Score (ESS) is 12/24 today. She has not fallen asleep while driving. The patient has been taking a unscheduled naps.  She has been known to snore for the past many years. Snoring is reportedly marked, and associated with choking sounds and witnessed apneas. The patient denies a sense of choking or strangling feeling. There is no report of nighttime reflux, with no nighttime cough experienced. The patient has not noted any RLS symptoms  and is not known to kick while asleep or before falling asleep. There is no family history of RLS or OSA, but her mother is a heavy snorer.  She is a restless sleeper and in the morning, the bed is quite disheveled.   She denies cataplexy, sleep paralysis, hypnagogic or hypnopompic hallucinations, or sleep attacks. She does not report any vivid dreams, nightmares, dream enactments, or parasomnias, such as sleep talking or sleep walking. The patient has not had a sleep study or a home sleep test.  She consumes 0 to 1 caffeinated beverages per day, usually in the form of coffee.   Her bedroom is usually dark and cool. There is a TV in the bedroom and usually it is on a timer at night.   Her Past Medical History Is Significant For: Past Medical History  Diagnosis Date  . History of blood clots   . Heart disease   . Hypertension   . Memory problem   . Stroke   . HLD (hyperlipidemia)     Her Past Surgical History Is Significant For: Past Surgical History  Procedure Laterality Date  . Abdominal hysterectomy  1998  . Percutaneous transluminal balloon angioplasty      W/ stent placement x2  . Foot surgery      right  . Toe surgery      left    Her Family History Is Significant For: Family History  Problem Relation Age of Onset  . Hypertension Mother   . Hypertension Father   . High Cholesterol Mother     Her  Social History Is Significant For: History   Social History  . Marital Status: Divorced    Spouse Name: N/A    Number of Children: 3  . Years of Education: some coll.   Occupational History  .      not employed   Social History Main Topics  . Smoking status: Never Smoker   . Smokeless tobacco: Never Used  . Alcohol Use: No  . Drug Use: No  . Sexual Activity: None   Other Topics Concern  . None   Social History Narrative   Patient consumes no caffeine    Her Allergies Are:  No Known Allergies:   Her Current Medications Are:  Outpatient Encounter  Prescriptions as of 11/08/2013  Medication Sig  . amLODipine (NORVASC) 10 MG tablet Take 10 mg by mouth daily.  . clopidogrel (PLAVIX) 75 MG tablet Take 75 mg by mouth daily.  . hydrocortisone 2.5 % cream Apply topically 2 (two) times daily as needed.   Marland Kitchen. lisinopril-hydrochlorothiazide (PRINZIDE,ZESTORETIC) 20-25 MG per tablet Take 1 tablet by mouth daily.  . metoprolol succinate (TOPROL-XL) 50 MG 24 hr tablet Take 50 mg by mouth 2 (two) times daily.   . Omega-3 Fatty Acids (THERAGRAN-M FISH OIL CONC PO) Take 210 mg by mouth daily. Specific dose unknown  . [DISCONTINUED] amLODipine (NORVASC) 5 MG tablet Take 1 tablet (5 mg total) by mouth daily. Needs office visit!!  . [DISCONTINUED] lovastatin (MEVACOR) 40 MG tablet Take 40 mg by mouth daily.  . [DISCONTINUED] ranitidine (ZANTAC) 150 MG capsule Take 1 capsule (150 mg total) by mouth daily.  :  Review of Systems:  Out of a complete 14 point review of systems, all are reviewed and negative with the exception of these symptoms as listed below:   Review of Systems  Neurological:       Snoring, memory loss,not enough sleep    Objective:  Neurologic Exam  Physical Exam Physical Examination:   Filed Vitals:   11/08/13 0916  BP: 117/79  Pulse: 65  Temp: 97.1 F (36.2 C)    General Examination: The patient is a very pleasant 53 y.o. female in no acute distress. She appears well-developed and well-nourished and adequately groomed.   HEENT: Normocephalic, atraumatic, pupils are equal, round and reactive to light and accommodation. She has mild b/l cataracts. Funduscopic exam is normal with sharp disc margins noted. Extraocular tracking is good without limitation to gaze excursion or nystagmus noted. Normal smooth pursuit is noted. Hearing is grossly intact. Tympanic membranes are clear bilaterally. Face is assymmetric with L lower facial weakness and normal facial sensation. Speech is mildly dysarthric, There is no hypophonia. There is no  lip, neck/head, jaw or voice tremor. Neck is supple with full range of passive and active motion. There are no carotid bruits on auscultation. Oropharynx exam reveals: mild mouth dryness, poor dental hygiene with full dentures which are missing teeth, and moderate to severe airway crowding, due to redundant soft palate and narrow airway entry. I could not visualize the uvula or tonsils. She has a sensitive gag reflex. . Mallampati is class III. Tongue protrudes centrally and palate elevates symmetrically. Neck size is 15 5/8 inches. She has a Absent overbite. Nasal inspection reveals no significant nasal mucosal bogginess or redness and no septal deviation.   Chest: Clear to auscultation without wheezing, rhonchi or crackles noted.  Heart: S1+S2+0, regular and normal without murmurs, rubs or gallops noted.   Abdomen: Soft, non-tender and non-distended with normal bowel sounds appreciated  on auscultation.  Extremities: There is no pitting edema in the distal lower extremities bilaterally. Pedal pulses are intact.  Skin: Warm and dry without trophic changes noted. There are no varicose veins.  Musculoskeletal: exam reveals no obvious joint deformities, tenderness or joint swelling or erythema.   Neurologically:  Mental status: The patient is awake, alert and oriented in all 4 spheres. Her immediate and remote memory, attention, language skills and fund of knowledge are appropriate. There is no evidence of aphasia, agnosia, apraxia or anomia. Speech is clear with normal prosody and enunciation. Thought process is linear. Mood is normal and affect is normal.  Cranial nerves II - XII are as described above under HEENT exam. In addition: shoulder shrug is normal with equal shoulder height noted. Motor exam: Normal bulk, strength and tone is noted on the R with increase tone in the LUE>LLE. She has 4/5 weakness and flexion contracture in the LUE. There is no drift, tremor or rebound. Romberg is negative.  Reflexes are 2+ on the R and 3+ on the L. Babinski: Toes are flexor bilaterally. Fine motor skills and coordination: intact on the R and mildly impaired on the L.   Cerebellar testing: No dysmetria or intention tremor on finger to nose testing. Heel to shin is unremarkable bilaterally. There is no truncal or gait ataxia.  Sensory exam: intact to light touch, pinprick, vibration, temperature sense in the upper and lower extremities on the R and decrease PP in the L hemibody.  Gait, station and balance: She stands easily. No veering to one side is noted. No leaning to one side is noted. Posture is age-appropriate and stance is narrow based. Gait shows decreased stride length and decreased pace. She has a mild limp and mild circumduction. Tandem walk is not tested               Assessment and Plan:   In summary, [name] is a very pleasant 53 y.o.-year old female with an underlying medical history of severe obesity, hyperlipidemia, hypertension, heart disease, status post stent placement, history of stroke, history of venous thrombosis, memory issues, who is reported to snore and have apneic pauses while asleep. She had an overnight pulse oximetry test on 10/14/2013, which was abnormal. I reviewed this test results with her. She has on examination a tight appearing airway but I could not fully visualize her uvula or tonsils. 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 She says she had a stroke some 6 years ago. We talked about secondary stroke prevention. I had a long chat with the patient and her daughter about my findings and the diagnosis of OSA, its prognosis and treatment options. We talked about medical treatments, surgical interventions and non-pharmacological approaches. I explained in particular the risks and ramifications of untreated moderate to severe OSA, especially with respect to developing cardiovascular disease down the Road, including congestive  heart failure, difficult to treat hypertension, cardiac arrhythmias, or stroke. Even type 2 diabetes has, in part, been linked to untreated OSA. Symptoms of untreated OSA include daytime sleepiness, memory problems, mood irritability and mood disorder such as depression and anxiety, lack of energy, as well as recurrent headaches, especially morning headaches. We talked about trying to maintain a healthy lifestyle in general, as well as the importance of weight control. I encouraged the patient to eat healthy, exercise daily and keep well hydrated, to keep a scheduled bedtime and wake time routine, to not skip any meals and  eat healthy snacks in between meals. I advised the patient not to drive when feeling sleepy, And she states she does not drive. I recommended the following at this time: sleep study with potential positive airway pressure titration. (We will score hypopneas at 4% and split the sleep study into diagnostic and treatment portion, if the estimated. 2 hour AHI is >15/h).   I explained the sleep test procedure to the patient and also outlined possible surgical and non-surgical treatment options of OSA, including the use of a custom-made dental device (which would require a referral to a specialist dentist or oral surgeon), upper airway surgical options, such as pillar implants, radiofrequency surgery, tongue base surgery, and UPPP (which would involve a referral to an ENT surgeon). Rarely, jaw surgery such as mandibular advancement may be considered.  I also explained the CPAP treatment option to the patient, who indicated that she would be willing to try CPAP if the need arises. I explained the importance of being compliant with PAP treatment, not only for insurance purposes but primarily to improve Her symptoms, and for the patient's long term health benefit, including to reduce Her cardiovascular risks. I answered all her questions today and the patient and her daughter were in agreement. I  would like to see her back after the sleep study is completed and encouraged them to call with any interim questions, concerns, problems or updates.   Thank you very much for allowing me to participate in the care of this nice patient. If I can be of any further assistance to you please do not hesitate to call me at [phone].  Sincerely,   Huston Foley, MD, PhD

## 2013-11-08 NOTE — Patient Instructions (Signed)

## 2013-11-27 ENCOUNTER — Ambulatory Visit: Payer: Medicare Other | Admitting: Cardiology

## 2013-12-18 ENCOUNTER — Ambulatory Visit (INDEPENDENT_AMBULATORY_CARE_PROVIDER_SITE_OTHER): Payer: Medicare PPO | Admitting: Neurology

## 2013-12-18 VITALS — BP 192/88 | HR 100 | Ht 61.75 in | Wt 231.0 lb

## 2013-12-18 DIAGNOSIS — G479 Sleep disorder, unspecified: Secondary | ICD-10-CM

## 2013-12-18 DIAGNOSIS — G4733 Obstructive sleep apnea (adult) (pediatric): Secondary | ICD-10-CM

## 2013-12-19 NOTE — Sleep Study (Signed)
Please see the scanned sleep study interpretation located in there Procedure tab within the Chart review section

## 2013-12-25 ENCOUNTER — Telehealth: Payer: Self-pay | Admitting: Neurology

## 2013-12-25 DIAGNOSIS — G4733 Obstructive sleep apnea (adult) (pediatric): Secondary | ICD-10-CM

## 2013-12-25 NOTE — Telephone Encounter (Signed)
Please call and notify patient that the recent sleep study confirmed the diagnosis of OSA. She did well with CPAP during the study with significant improvement of the respiratory events. Therefore, I would like start the patient on CPAP at home. I placed the order in the chart.   Arrange for CPAP set up at home through a DME company of patient's choice and fax/route report to PCP and referring MD (if other than PCP).   The patient will also need a follow up appointment with me in 6-8 weeks post set up that has to be scheduled; help the patient schedule this (in a follow-up slot).   Please re-enforce the importance of compliance with treatment and the need for us to monitor compliance data.   Once you have spoken to the patient and scheduled the return appointment, you may close this encounter, thanks,   Treylan Mcclintock, MD, PhD [facility]    

## 2013-12-30 ENCOUNTER — Encounter: Payer: Self-pay | Admitting: *Deleted

## 2013-12-30 ENCOUNTER — Encounter: Payer: Self-pay | Admitting: Neurology

## 2013-12-30 NOTE — Telephone Encounter (Signed)
Patient was contacted and provided the results of her split night sleep study that revealed severe obstructive sleep apnea.  Patient was informed that treatment in the form of CPAP therapy had been advised.  Patient was in agreement with proceeding forward with treatment.  Patient was referred to [facility] for CPAP set up.   Patient instructed to contact our office 6-8 weeks post set up to schedule a follow up appointment.

## 2014-01-28 ENCOUNTER — Ambulatory Visit: Payer: Medicare Other | Admitting: Cardiology

## 2014-02-13 IMAGING — CR DG CHEST 2V
3 series · 3 of 3 positions shown · non-contrast
Comparison: Chest radiograph and CTA of the chest performed
06/10/2009

CLINICAL DATA: Chest pain and shortness of breath.

CHEST - 2 VIEW

[w chest pa]
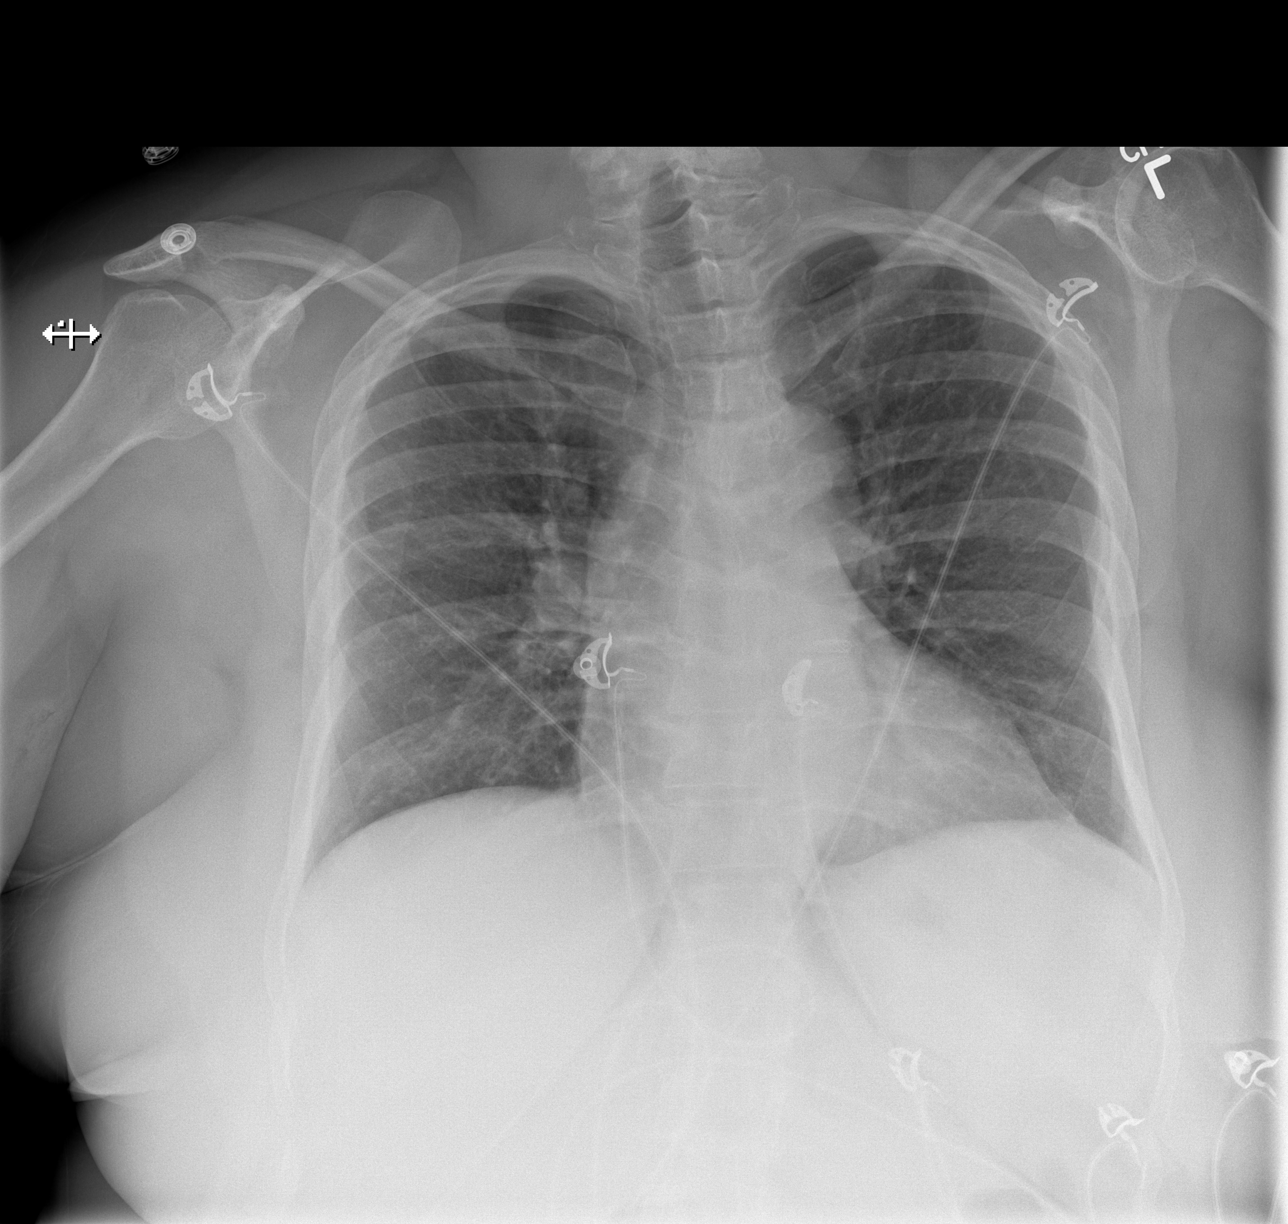

[w chest lat (1 of 2)]
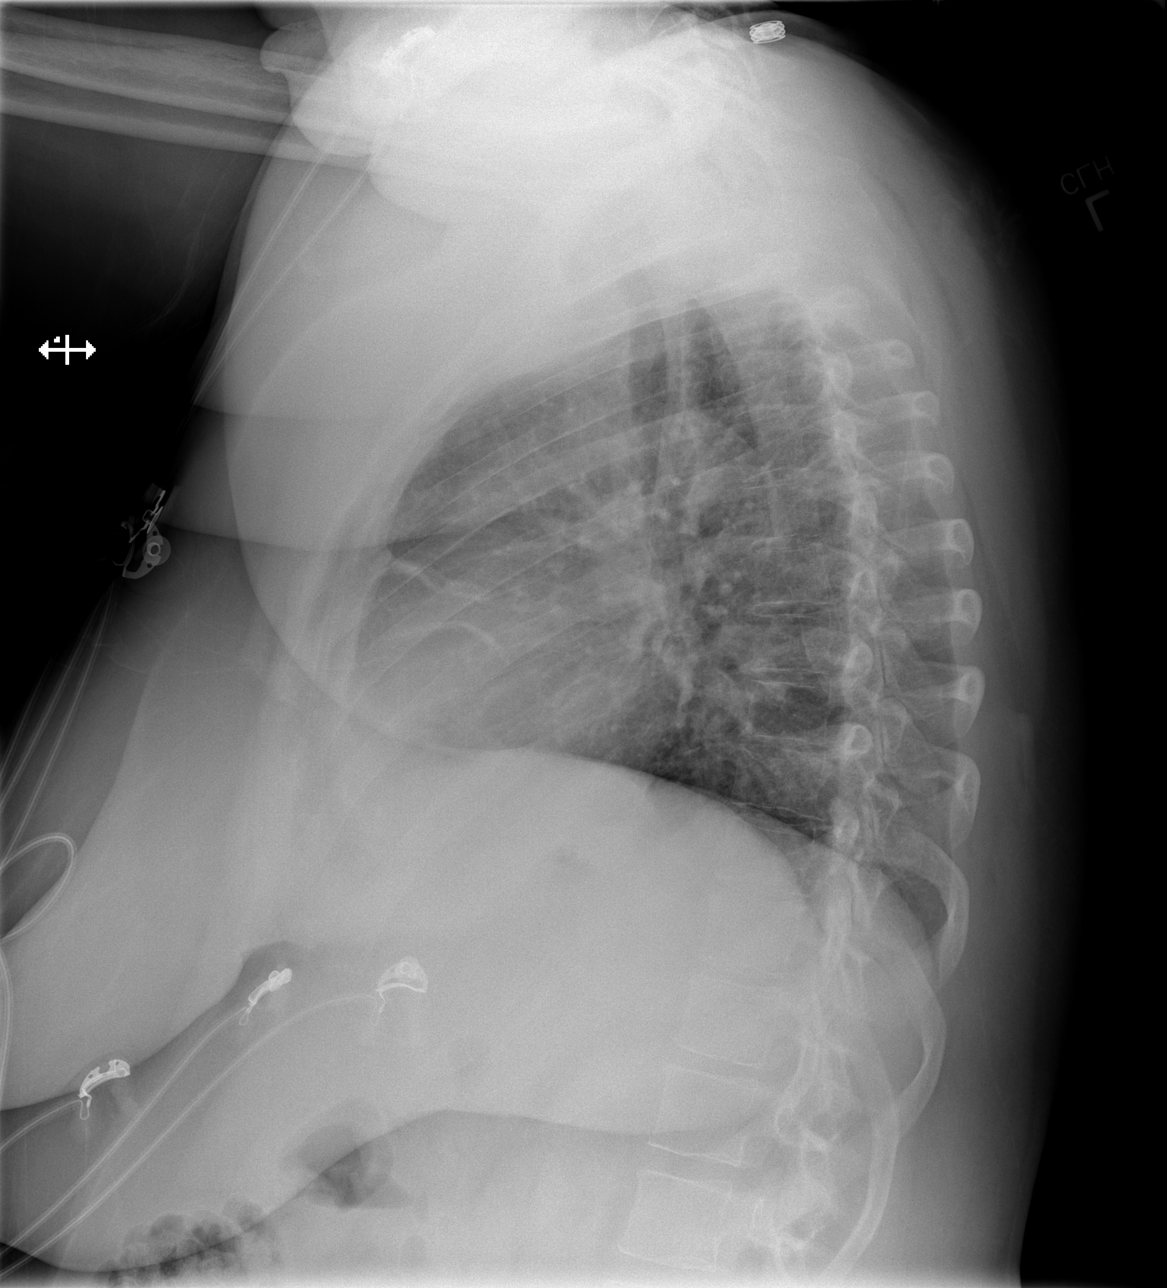

[w chest lat (2 of 2)]
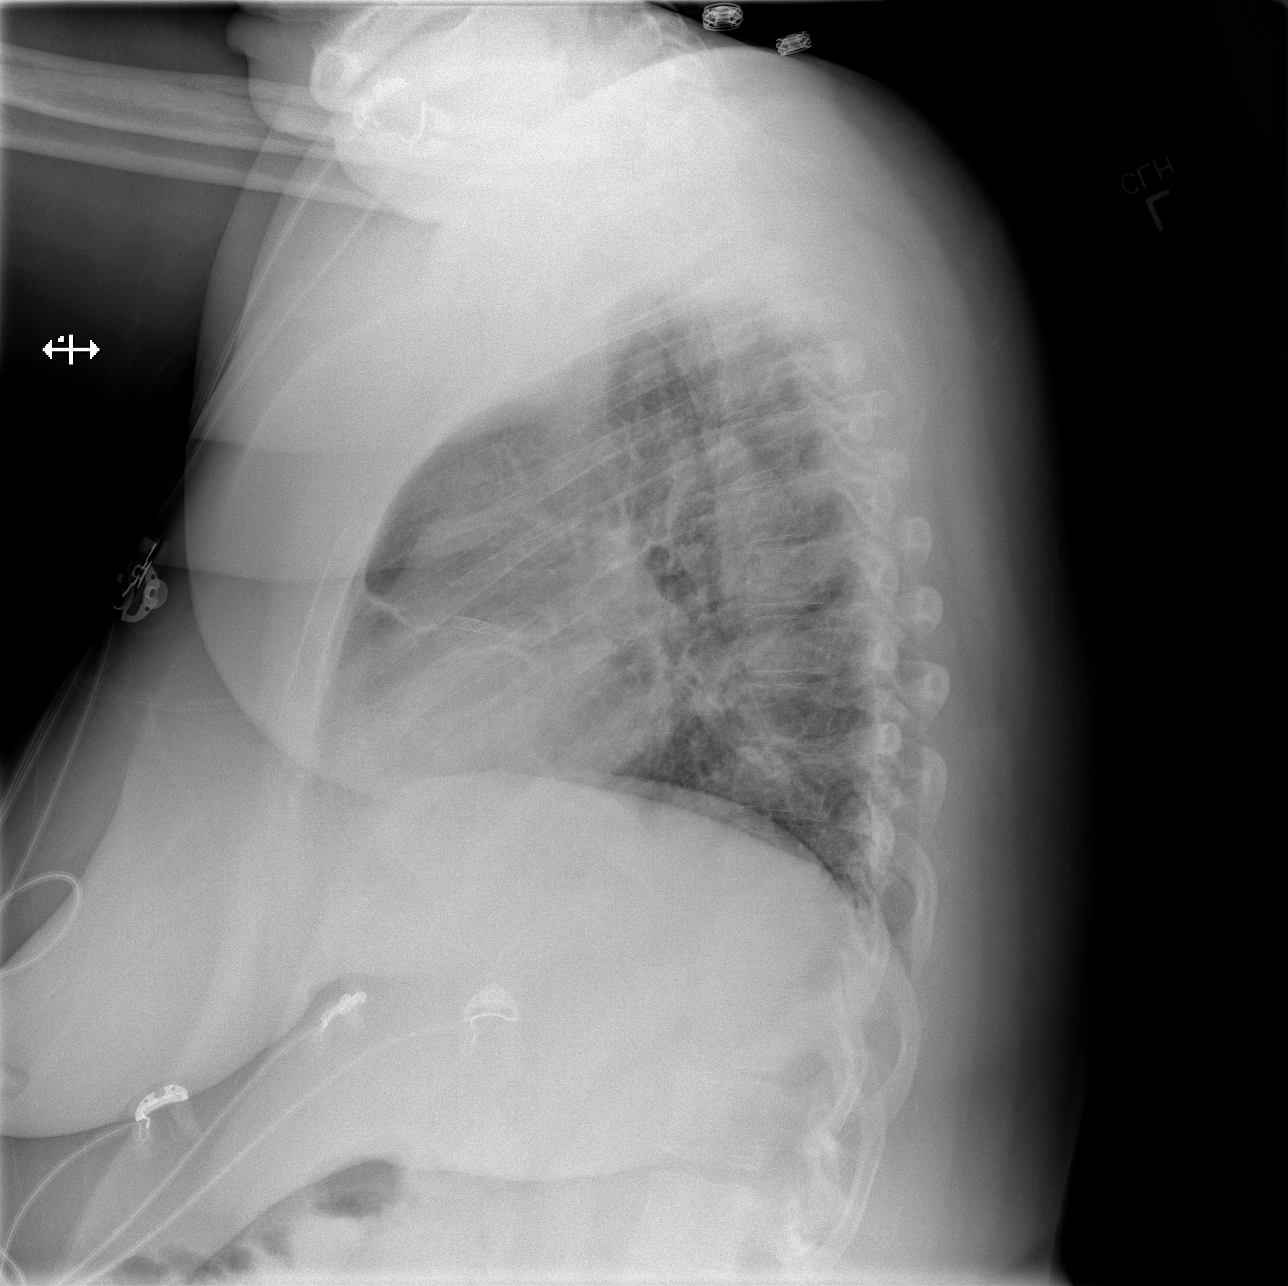

[3 of 3 positions shown; findings below may reference images not displayed]

FINDINGS: The lungs are well-aerated.  Mild peribronchial
thickening is noted.  There is no evidence of focal opacification,
pleural effusion or pneumothorax.

The heart is normal in size; the mediastinal contour is within
normal limits.  No acute osseous abnormalities are seen.
IMPRESSION: Mild peribronchial thickening noted; lungs otherwise clear.

## 2014-02-25 ENCOUNTER — Ambulatory Visit (INDEPENDENT_AMBULATORY_CARE_PROVIDER_SITE_OTHER): Payer: Medicare PPO | Admitting: Cardiology

## 2014-02-25 ENCOUNTER — Encounter: Payer: Self-pay | Admitting: Cardiology

## 2014-02-25 VITALS — BP 102/78 | HR 68 | Ht 64.0 in | Wt 222.0 lb

## 2014-02-25 DIAGNOSIS — R9431 Abnormal electrocardiogram [ECG] [EKG]: Secondary | ICD-10-CM | POA: Insufficient documentation

## 2014-02-25 DIAGNOSIS — I259 Chronic ischemic heart disease, unspecified: Secondary | ICD-10-CM

## 2014-02-25 DIAGNOSIS — E7800 Pure hypercholesterolemia, unspecified: Secondary | ICD-10-CM

## 2014-02-25 DIAGNOSIS — I639 Cerebral infarction, unspecified: Secondary | ICD-10-CM

## 2014-02-25 DIAGNOSIS — I10 Essential (primary) hypertension: Secondary | ICD-10-CM

## 2014-02-25 NOTE — Patient Instructions (Signed)
Your physician recommends that you continue on your current medications as directed. Please refer to the Current Medication list given to you today.  Your physician has requested that you have a lexiscan myoview. For further information please visit [email or web address]. Please follow instruction sheet, as given.  Your physician wants you to follow-up in: 6 month ov You will receive a reminder letter in the mail two months in advance. If you don't receive a letter, please call our office to schedule the follow-up appointment.  

## 2014-02-25 NOTE — Progress Notes (Signed)
[name] Date of Birth:  1960/04/17 [facility] [address] [place], [place]  [id number] [phone]        Fax   [phone]   History of Present Illness: This pleasant 54 year old woman is seen by me for the first time today.  She is a medical patient of Dr.Bouska.  She has a history of remote ischemic heart disease.  At age 54 she had a myocardial infarction and had 2 stents placed.  Several years later she had a series of 2 or 3 strokes.  She recalls being on warfarin for a while but it was subtotally stopped.  She does not recall why it was stopped.  She was not familiar with the term atrial fibrillation.  At the present time she is on Plavix.  She is not on aspirin.  She has not had any recent chest discomfort or shortness of breath.  She tries to walk for exercise.  She walks about a mile and a 3:30 times a week.  She has had obesity.  She has recently been able to lose about 6 pounds.  She has a problem with decreased memory which she attributes to her strokes.  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  She is now on disability.  She has a history of high cholesterol and hypertension.  She is not diabetic.  She has not had any symptoms of congestive heart failure or peripheral edema. Her family history reveals that her mother is living at age 56 and as high blood pressure.  Father is 5680 and has high blood pressure.  There is no history of premature coronary disease in the family.  She is a nonsmoker. Current Outpatient Prescriptions  Medication Sig Dispense Refill  . amLODipine (NORVASC) 10 MG tablet Take 10 mg by mouth daily.    Marland Kitchen. atorvastatin (LIPITOR) 20 MG tablet Take 40 mg by mouth.    . clopidogrel (PLAVIX) 75 MG tablet Take 75 mg by mouth daily.    . clotrimazole (LOTRIMIN) 1 % cream Apply topically.    . hydrochlorothiazide (HYDRODIURIL) 25 MG tablet TAKE 1 TABLET EVERY DAY FOR BLOOD PRESSURE    . hydrocortisone 2.5 % cream  Apply topically 2 (two) times daily as needed.     Marland Kitchen. lisinopril-hydrochlorothiazide (PRINZIDE,ZESTORETIC) 20-25 MG per tablet Take 1 tablet by mouth daily.    . metoprolol (LOPRESSOR) 50 MG tablet Take 50 mg by mouth.    . metoprolol succinate (TOPROL-XL) 50 MG 24 hr tablet Take 50 mg by mouth 2 (two) times daily.     . Omega-3 Fatty Acids (THERAGRAN-M FISH OIL CONC PO) Take 210 mg by mouth daily. Specific dose unknown     No current facility-administered medications for this visit.    No Known Allergies  Patient Active Problem List   Diagnosis Date Noted  . Abnormal EKG 02/25/2014  . CAD (coronary artery disease) 07/15/2011  . HTN (hypertension) 07/15/2011  . HLD (hyperlipidemia) 07/15/2011  . History of stroke 07/15/2011    History  Smoking status  . Never Smoker   Smokeless tobacco  . Never Used    History  Alcohol Use No    Family History  Problem Relation Age of Onset  . Hypertension Mother   . Hypertension Father   . High Cholesterol Mother     Review of Systems: Constitutional: no fever chills diaphoresis or fatigue or change in weight.  Head and neck: no hearing loss,  no epistaxis, no photophobia or visual disturbance. Respiratory: No cough, shortness of breath or wheezing. Cardiovascular: No chest pain peripheral edema, palpitations. Gastrointestinal: No abdominal distention, no abdominal pain, no change in bowel habits hematochezia or melena. Genitourinary: No dysuria, no frequency, no urgency, no nocturia. Musculoskeletal:No arthralgias, no back pain, no gait disturbance or myalgias. Neurological: No dizziness, no headaches, no numbness, no seizures, no syncope, no weakness, no tremors. Hematologic: No lymphadenopathy, no easy bruising. Psychiatric: No confusion, no hallucinations, no sleep disturbance.   Wt Readings from Last 3 Encounters:  02/25/14 222 lb (100.699 kg)  12/19/13 231 lb (104.781 kg)  11/08/13 231 lb (104.781 kg)    Physical  Exam: Filed Vitals:   02/25/14 1052  BP: 102/78  Pulse: 68  The patient appears to be in no distress.  Head and neck exam reveals that the pupils are equal and reactive.  The extraocular movements are full.  There is no scleral icterus.  Mouth and pharynx are benign.  No lymphadenopathy.  No carotid bruits.  The jugular venous pressure is normal.  Thyroid is not enlarged or tender.  Chest is clear to percussion and auscultation.  No rales or rhonchi.  Expansion of the chest is symmetrical.  Heart reveals no abnormal lift or heave.  First and second heart sounds are normal.  There is no murmur gallop rub or click.  The abdomen is soft and nontender.  Bowel sounds are normoactive.  There is no hepatosplenomegaly or mass.  There are no abdominal bruits.  Extremities reveal no phlebitis or edema.  Pedal pulses are good.  There is no cyanosis or clubbing.  Neurologic exam is normal strength and no lateralizing weakness.  No sensory deficits.  Integument reveals no rash  EKG today shows normal sinus rhythm with left axis deviation and a pattern of an old anteroseptal myocardial infarction.  Nonspecific T-wave flattening is present.  Since the previous tracing of 05/26/12, heart rate is slower.  Assessment / Plan: 1.  Ischemic heart disease status post prior myocardial infarction 15 years ago treated with 2 stents.  Dr.DeGent was her cardiologist at that time.  No subsequent ischemic risk stratification 2.  Past history of strokes 3.  Essential hypertension without heart failure 4.  Hypercholesterolemia  Disposition: The patient is to continue on her current medication.  Blood pressure is satisfactory at this time.  We will have the patient return for a walking Lexiscan Myoview stress test to assess her coronary disease further. Recheck here in 6 months for cardiology follow-up. Many thanks for the opportunity to see this pleasant woman.  We will be in touch regarding the results of her  Myoview stress test.

## 2014-02-26 ENCOUNTER — Telehealth: Payer: Self-pay | Admitting: Cardiology

## 2014-02-26 NOTE — Telephone Encounter (Signed)
New Msg        Pt calling states she is calling about Metoprolol. No additional info given.    Please return pt call.

## 2014-02-26 NOTE — Telephone Encounter (Signed)
Spoke with patient and she is taking Metoprolol Tartrate 50 mg twice a day Changes made in chart

## 2014-03-04 ENCOUNTER — Encounter: Payer: Self-pay | Admitting: Cardiology

## 2014-03-10 ENCOUNTER — Encounter: Payer: Self-pay | Admitting: Cardiology

## 2014-03-19 ENCOUNTER — Ambulatory Visit (HOSPITAL_COMMUNITY): Payer: Medicare PPO | Attending: Cardiovascular Disease | Admitting: Radiology

## 2014-03-19 DIAGNOSIS — R9431 Abnormal electrocardiogram [ECG] [EKG]: Secondary | ICD-10-CM | POA: Diagnosis not present

## 2014-03-19 DIAGNOSIS — I259 Chronic ischemic heart disease, unspecified: Secondary | ICD-10-CM | POA: Insufficient documentation

## 2014-03-19 MED ORDER — TECHNETIUM TC 99M SESTAMIBI GENERIC - CARDIOLITE
11.0000 | Freq: Once | INTRAVENOUS | Status: AC | PRN
Start: 2014-03-19 — End: 2014-03-19
  Administered 2014-03-19: 11 via INTRAVENOUS

## 2014-03-19 MED ORDER — REGADENOSON 0.4 MG/5ML IV SOLN
0.4000 mg | Freq: Once | INTRAVENOUS | Status: AC
  Administered 2014-03-19: 0.4 mg via INTRAVENOUS

## 2014-03-19 MED ORDER — TECHNETIUM TC 99M SESTAMIBI GENERIC - CARDIOLITE
33.0000 | Freq: Once | INTRAVENOUS | Status: AC | PRN
  Administered 2014-03-19: 33 via INTRAVENOUS

## 2014-03-19 NOTE — Progress Notes (Signed)
[facility] SITE 3 NUCLEAR MED [address] [place], [place] [address] [phone]    Cardiology Nuclear Med Study  [name] is a 54 y.o. female     MRN : [id number]     DOB: 1960-12-11  Procedure Date: 03/19/2014  Nuclear Med Background Indication for Stress Test:  Evaluation for Ischemia, Stent Patency and Abnormal EKG History:  CAD-Stent, 10/10/02 MPI: EF: 40% Ischemia/Scar, CVA Cardiac Risk Factors: Hypertension  Symptoms:  DOE   Nuclear Pre-Procedure Caffeine/Decaff Intake:  None> 12 hrs NPO After: 8:30pm   Lungs:  clear O2 Sat: 94% on room air. IV 0.9% NS with Angio Cath:  22g  IV Site: R Forearm x 1, tolerated well IV Started by:  Irean HongPatsy Edwards, RN  Chest Size (in):  46 Cup Size: D  Height: 5\' 4"  (1.626 m)  Weight:  224 lb (101.606 kg)  BMI:  Body mass index is 38.43 kg/(m^2). Tech Comments:  Patient took Lopressor this am. Irean HongPatsy Edwards, RN.    Nuclear Med Study 1 or 2 day study: 1 day  Stress Test Type:  Eugenie BirksLexiscan  Reading MD: N/A  Order Authorizing Provider:  Cassell Clementhomas Brackbill, MD  Resting Radionuclide: Technetium 7313m Sestamibi  Resting Radionuclide Dose: 11.0 mCi   Stress Radionuclide:  Technetium 1513m Sestamibi  Stress Radionuclide Dose: 33.0 mCi           Stress Protocol Rest HR: 67 Stress HR: 97  Rest BP: 104/82 Stress BP: 97/42  Exercise Time (min): n/a METS: n/a   Predicted Max HR: 165 bpm % Max HR: 58.79 bpm Rate Pressure Product: [id number]   Dose of Adenosine (mg):  n/a Dose of Lexiscan: 0.4 mg  Dose of Atropine (mg): n/a there is a large area of severe decreased uptake affecting the mid/apical anterior segments, mid anteroseptal segment, mid inferoseptal segment,  Dose of Dobutamine: n/a mcg/kg/min (at max HR)  Stress Test Technologist: Milana NaSabrina Williams, EMT-P  Nuclear Technologist:  Kerby NoraElzbieta Kubak, CNMT     Rest Procedure:  Myocardial perfusion imaging was performed at rest 45 minutes following the intravenous administration of  Technetium 2113m Sestamibi. Rest ECG: Normal sinus rhythm. Nonspecific ST-T wave abnormalities. Decreased anterior R-wave progression.  Stress Procedure:  The patient received IV Lexiscan 0.4 mg over 15-seconds.  Technetium 713m Sestamibi injected at 30-seconds. This patient was sob, had chest pain, and was woozy with the Lexiscan injection. Quantitative spect images were obtained after a 45 minute delay. Stress ECG: No significant change from baseline ECG  QPS Raw Data Images:  Normal; no motion artifact; normal heart/lung ratio. Stress Images:  There is a large area of severe scar affecting the mid/apical anterior segments, mid anteroseptal segment, mid inferoseptal segment, apical septal segment, mid/apical inferior segments, mid inferolateral segment, lateral apical segment, and the apical cap.  Rest Images:  The rest images reveal a large area of moderately decreased uptake in most of the areas outlined under the stress images. There is some reversibility. Subtraction (SDS):  There is evidence of mild scar throughout the larger area of scar outlined under the stress images. Transient Ischemic Dilatation (Normal <1.22):  0.96 Lung/Heart Ratio (Normal <0.45):  0.29  Quantitative Gated Spect Images QGS EDV:  113 ml QGS ESV:  57 ml  Impression Exercise Capacity:  Lexiscan with low level exercise. BP Response:  Normal blood pressure response. Clinical Symptoms:  Shortness of breath and chest discomfort ECG Impression:  No significant ST segment change suggestive of ischemia. Comparison with Prior Nuclear Study:  This study is compared with the report and the hard copy images from the study from 2004.  Overall Impression:  This study is abnormal. This is a moderate risk scan with abnormal left ventricular function and scar and ischemia. The area of scar affects the entire apex and several parts of the mid ventricle. This is the same area that was described on the study in 2004. In addition there  is mixed ischemia in this scar. This was also described in 2004.  LV Ejection Fraction: 49%.  LV Wall Motion:  There is akinesis with slight dyskinesis at the apex. There is also hypokinesis of the septum.  Jerral Bonito, MD

## 2016-04-11 ENCOUNTER — Telehealth: Payer: Self-pay | Admitting: Neurology

## 2016-05-16 NOTE — Telephone Encounter (Signed)
na

## 2016-06-22 ENCOUNTER — Telehealth: Payer: Self-pay | Admitting: Neurology

## 2016-06-22 NOTE — Telephone Encounter (Signed)
[facility], they contacted patient 3 times and left VM's. Patient did not respond.

## 2016-08-23 ENCOUNTER — Ambulatory Visit (INDEPENDENT_AMBULATORY_CARE_PROVIDER_SITE_OTHER): Payer: Medicare PPO | Admitting: Cardiovascular Disease

## 2016-08-23 ENCOUNTER — Encounter: Payer: Self-pay | Admitting: Cardiovascular Disease

## 2016-08-23 ENCOUNTER — Encounter (INDEPENDENT_AMBULATORY_CARE_PROVIDER_SITE_OTHER): Payer: Self-pay

## 2016-08-23 VITALS — BP 122/86 | HR 71 | Ht 63.0 in | Wt 234.6 lb

## 2016-08-23 DIAGNOSIS — I2510 Atherosclerotic heart disease of native coronary artery without angina pectoris: Secondary | ICD-10-CM | POA: Diagnosis not present

## 2016-08-23 DIAGNOSIS — E7800 Pure hypercholesterolemia, unspecified: Secondary | ICD-10-CM | POA: Diagnosis not present

## 2016-08-23 DIAGNOSIS — I10 Essential (primary) hypertension: Secondary | ICD-10-CM

## 2016-08-23 NOTE — Assessment & Plan Note (Signed)
History of hypertension blood pressure rose to 122/86. She is on amlodipine, losartan and metoprolol. Continue current meds at current dosing

## 2016-08-23 NOTE — Assessment & Plan Note (Addendum)
History of hyperlipidemia  On statin therapy  Followed by her PCP

## 2016-08-23 NOTE — Assessment & Plan Note (Signed)
History of CAD status post stenting remotely when she was 56 years old. She had a scar in the apex on Myoview stress testing 03/19/14 but no ischemia. She denies chest pain or shortness of breath.

## 2016-08-23 NOTE — Progress Notes (Signed)
08/23/2016 [name]   May 07, 1960  [id number]  Primary Physician Tracey Harries, MD Primary Cardiologist: Runell Gess MD Nicholes Calamity, [place]  HPI:  [name] is a 56 y.o. female  Divorced, mother of 3, grandmother are 7 grandchildren was referred by Zoe Lan  Nurse prctitioner for cardiovascular follow-up.  She was previously a patient of Dr. Clydene Laming. She has a history of treated hypertension and hyperlipidemia. She is moderately obese. She did have stents placed at age 45 and a nonischemic Myoview 03/19/14 with apical scar. She denies chest pain or shortness of breath.   Current Meds  Medication Sig  . amLODipine (NORVASC) 10 MG tablet Take 10 mg by mouth daily.  Marland Kitchen atorvastatin (LIPITOR) 20 MG tablet Take 40 mg by mouth.  . clopidogrel (PLAVIX) 75 MG tablet Take 75 mg by mouth daily.  Marland Kitchen donepezil (ARICEPT) 10 MG tablet Take 1 tablet by mouth daily.  Marland Kitchen ezetimibe (ZETIA) 10 MG tablet Take 10 mg by mouth daily.  . ferrous sulfate 220 (44 Fe) MG/5ML solution Take 220 mg by mouth daily.  . hydrochlorothiazide (HYDRODIURIL) 25 MG tablet TAKE 1 TABLET EVERY DAY FOR BLOOD PRESSURE  . losartan (COZAAR) 50 MG tablet Take 50 mg by mouth daily.  . metoprolol (LOPRESSOR) 50 MG tablet Take 50 mg by mouth 2 (two) times daily.   . Multiple Vitamins-Minerals (MULTIVITAMIN ADULT PO) Take 1 tablet by mouth daily.  . Omega-3 Fatty Acids (THERAGRAN-M FISH OIL CONC PO) Take 210 mg by mouth daily. Specific dose unknown     Allergies  Allergen Reactions  . Ace Inhibitors Other (See Comments)    cough    Social History   Social History  . Marital status: Divorced    Spouse name: N/A  . Number of children: 3  . Years of education: some coll.   Occupational History  .      not employed   Social History Main Topics  . Smoking status: Never Smoker  . Smokeless tobacco: Never Used  . Alcohol use No  . Drug use: No  . Sexual activity: Not on file   Other  Topics Concern  . Not on file   Social History Narrative   Patient consumes no caffeine     Review of Systems: General: negative for chills, fever, night sweats or weight changes.  Cardiovascular: negative for chest pain, dyspnea on exertion, edema, orthopnea, palpitations, paroxysmal nocturnal dyspnea or shortness of breath Dermatological: negative for rash Respiratory: negative for cough or wheezing Urologic: negative for hematuria Abdominal: negative for nausea, vomiting, diarrhea, bright red blood per rectum, melena, or hematemesis Neurologic: negative for visual changes, syncope, or dizziness All other systems reviewed and are otherwise negative except as noted above.    Blood pressure 122/86, pulse 71, height 5\' 3"  (1.6 m), weight 234 lb 9.6 oz (106.4 kg).  General appearance: alert and no distress Neck: no adenopathy, no carotid bruit, no JVD, supple, symmetrical, trachea midline and thyroid not enlarged, symmetric, no tenderness/mass/nodules Lungs: clear to auscultation bilaterally Heart: regular rate and rhythm, S1, S2 normal, no murmur, click, rub or gallop Extremities: extremities normal, atraumatic, no cyanosis or edema  EKG normal sinus rhythm at 71 with septal Q waves and left axis deviation. I personally reviewed this EKG.  ASSESSMENT AND PLAN:   CAD (coronary artery disease) History of CAD status post stenting remotely when she was 56 years old. She had a scar in the apex on Myoview  stress testing 03/19/14 but no ischemia. She denies chest pain or shortness of breath.  HTN (hypertension) History of hypertension blood pressure rose to 122/86. She is on amlodipine, losartan and metoprolol. Continue current meds at current dosing  HLD (hyperlipidemia)  History of hyperlipidemia  On statin therapy  Followed by her PCP      Runell Gess MD [facility] 08/23/2016 2:14 PM

## 2016-08-23 NOTE — Patient Instructions (Signed)
Medication Instructions: Your physician recommends that you continue on your current medications as directed. Please refer to the Current Medication list given to you today.  Labwork: I will request recent lab work from your Primary Care Physician.   Follow-Up: Your physician wants you to follow-up in: 1 year with Dr. Allyson Sabal. You will receive a reminder letter in the mail two months in advance. If you don't receive a letter, please call our office to schedule the follow-up appointment.  If you need a refill on your cardiac medications before your next appointment, please call your pharmacy.

## 2018-08-13 ENCOUNTER — Encounter (HOSPITAL_COMMUNITY): Payer: Self-pay | Admitting: Emergency Medicine

## 2018-08-13 ENCOUNTER — Other Ambulatory Visit: Payer: Self-pay

## 2018-08-13 ENCOUNTER — Emergency Department (HOSPITAL_COMMUNITY)
Admission: EM | Admit: 2018-08-13 | Discharge: 2018-08-13 | Disposition: A | Discharge status: Home / Self Care | Payer: Medicare PPO | Attending: Emergency Medicine | Admitting: Emergency Medicine

## 2018-08-13 DIAGNOSIS — Z79899 Other long term (current) drug therapy: Secondary | ICD-10-CM | POA: Diagnosis not present

## 2018-08-13 DIAGNOSIS — Z7902 Long term (current) use of antithrombotics/antiplatelets: Secondary | ICD-10-CM | POA: Diagnosis not present

## 2018-08-13 DIAGNOSIS — I10 Essential (primary) hypertension: Secondary | ICD-10-CM | POA: Insufficient documentation

## 2018-08-13 DIAGNOSIS — Z8673 Personal history of transient ischemic attack (TIA), and cerebral infarction without residual deficits: Secondary | ICD-10-CM | POA: Insufficient documentation

## 2018-08-13 DIAGNOSIS — F329 Major depressive disorder, single episode, unspecified: Secondary | ICD-10-CM | POA: Insufficient documentation

## 2018-08-13 DIAGNOSIS — F32A Depression, unspecified: Secondary | ICD-10-CM

## 2018-08-13 NOTE — ED Triage Notes (Signed)
Pt reports that her daughter and her got into a verbal argument Saturday over money and her room not being cleaned. She kept the grandchildren later that night so daughter could go out to eat, patient thought everything was fine. Today her daughter told her she had an appt and brought her off and dropped her off and left. Pt denies Si or HI. She did admit to making comment Saturday about her daughter killing her while they were arguing.
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
## 2021-09-10 ENCOUNTER — Encounter: Payer: Self-pay | Admitting: Internal Medicine
